# Patient Record
Sex: Female | Born: 1995 | Race: White | Hispanic: No | Marital: Single | Smoking: Never smoker
Health system: Southern US, Community
[De-identification: ages and names within clinical notes are randomized; demographics above are authoritative.]

## PROBLEM LIST (undated history)

## (undated) DIAGNOSIS — B977 Papillomavirus as the cause of diseases classified elsewhere: Secondary | ICD-10-CM

## (undated) DIAGNOSIS — K589 Irritable bowel syndrome without diarrhea: Secondary | ICD-10-CM

## (undated) DIAGNOSIS — R51 Headache: Secondary | ICD-10-CM

## (undated) DIAGNOSIS — R519 Headache, unspecified: Secondary | ICD-10-CM

## (undated) DIAGNOSIS — S060X9A Concussion with loss of consciousness of unspecified duration, initial encounter: Secondary | ICD-10-CM

## (undated) DIAGNOSIS — F419 Anxiety disorder, unspecified: Secondary | ICD-10-CM

## (undated) DIAGNOSIS — B009 Herpesviral infection, unspecified: Secondary | ICD-10-CM

## (undated) HISTORY — PX: TYMPANOSTOMY TUBE PLACEMENT: SHX32

## (undated) HISTORY — DX: Anxiety disorder, unspecified: F41.9

## (undated) HISTORY — DX: Concussion with loss of consciousness of unspecified duration, initial encounter: S06.0X9A

## (undated) HISTORY — DX: Papillomavirus as the cause of diseases classified elsewhere: B97.7

## (undated) HISTORY — DX: Headache: R51

## (undated) HISTORY — DX: Irritable bowel syndrome, unspecified: K58.9

## (undated) HISTORY — DX: Headache, unspecified: R51.9

## (undated) HISTORY — DX: Herpesviral infection, unspecified: B00.9

---
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
## 2014-02-07 ENCOUNTER — Ambulatory Visit: Payer: BC Managed Care – PPO | Admitting: Nurse Practitioner

## 2014-02-08 ENCOUNTER — Ambulatory Visit (INDEPENDENT_AMBULATORY_CARE_PROVIDER_SITE_OTHER): Payer: BC Managed Care – PPO | Admitting: Nurse Practitioner

## 2014-02-08 ENCOUNTER — Encounter: Payer: Self-pay | Admitting: Nurse Practitioner

## 2014-02-08 VITALS — BP 90/60 | HR 60 | Ht 63.5 in | Wt 124.0 lb

## 2014-02-08 DIAGNOSIS — Z113 Encounter for screening for infections with a predominantly sexual mode of transmission: Secondary | ICD-10-CM

## 2014-02-08 DIAGNOSIS — Z0000 Encounter for general adult medical examination without abnormal findings: Secondary | ICD-10-CM

## 2014-02-08 DIAGNOSIS — Z975 Presence of (intrauterine) contraceptive device: Secondary | ICD-10-CM

## 2014-02-08 DIAGNOSIS — Z01419 Encounter for gynecological examination (general) (routine) without abnormal findings: Secondary | ICD-10-CM

## 2014-02-08 DIAGNOSIS — M2550 Pain in unspecified joint: Secondary | ICD-10-CM

## 2014-02-08 NOTE — Progress Notes (Signed)
Patient ID: Hannah Pham, female   DOB: 11/16/1995, 18 y.o.   MRN: [id number] 18 y.o. G0P0 Single Caucasian Fe here for annual exam.  While on Mircette did have regular cycles that were shorter and less cramps. Then first of June misplaced pack of OCP and has been off.  Now wants Skyla Pham. She feels she is not a good candidate to remain on OCP secondary to compliance.   While here she states she has multiple joint pain that is fleeting and at random times. This pain or shooting sensation last seconds to less than a minute.  Seems to be more bothersome in the shoulders.  There is no swelling, warmth or loss of ROM with these events.   These symptoms have been ongoing since she was in elementary school.  Sister who is younger has similar symptoms.  Her mother has several auto immune disorders including Lupus and fibromyalgia.   Patient's last menstrual period was 01/21/2014.          Sexually active: no, not currently The current method of family planning is none.    Exercising: no  The patient does not participate in regular exercise at present. Smoker:  no  Health Maintenance: Pap:  never TDaP:  UTD Labs:  Declined    reports that she has never smoked. She does not have any smokeless tobacco history on file. She reports that she does not drink alcohol or use illicit drugs.  Past Medical History  Diagnosis Date  . Anxiety   . IBS (irritable bowel syndrome)     Past Surgical History  Procedure Laterality Date  . Tympanostomy tube placement Bilateral     Current Outpatient Prescriptions  Medication Sig Dispense Refill  . dicyclomine (BENTYL) 10 MG capsule Take 1 capsule by mouth as needed.      Marland Kitchen. ibuprofen (ADVIL,MOTRIN) 800 MG tablet Take 1 tablet (800 mg total) by mouth every 8 (eight) hours as needed for pain.  30 tablet  5  . Probiotic Product (SOLUBLE FIBER/PROBIOTICS PO) Take 1 capsule by mouth.       No current facility-administered medications for this visit.    Family  History  Problem Relation Age of Onset  . Hypertension Mother   . Hyperlipidemia Mother   . Migraines Mother   . Asthma Maternal Grandmother   . Colon cancer Maternal Grandfather   . Diabetes Maternal Grandfather   . Hyperlipidemia Maternal Grandfather     ROS:  Pertinent items are noted in HPI.  Otherwise, a comprehensive ROS was negative.  Exam:   BP 90/60  Pulse 60  Ht 5' 3.5" (1.613 m)  Wt 124 lb (56.246 kg)  BMI 21.62 kg/m2  LMP 01/21/2014 Height: 5' 3.5" (161.3 cm)  Ht Readings from Last 3 Encounters:  02/08/14 5' 3.5" (1.613 m) (38%*, Z = -0.30)  10/27/13 5' 3.75" (1.619 m) (42%*, Z = -0.20)  06/04/13 5\' 3"  (1.6 m) (31%*, Z = -0.49)   * Growth percentiles are based on CDC 2-20 Years data.    General appearance: alert, cooperative and appears stated age Head: Normocephalic, without obvious abnormality, atraumatic Neck: no adenopathy, supple, symmetrical, trachea midline and thyroid normal to inspection and palpation Lungs: clear to auscultation bilaterally Breasts: normal appearance, no masses or tenderness Heart: regular rate and rhythm Abdomen: soft, non-tender; no masses,  no organomegaly Extremities: extremities normal, atraumatic, no cyanosis or edema Skin: Skin color, texture, turgor normal. No rashes or lesions Lymph nodes: Cervical, supraclavicular, and axillary nodes normal. No  abnormal inguinal nodes palpated Neurologic: Grossly normal   Pelvic: External genitalia:  no lesions              Urethra:  normal appearing urethra with no masses, tenderness or lesions              Bartholin's and Skene's: normal                 Vagina: normal appearing vagina with normal color and discharge, no lesions              Cervix: anteverted              Pap taken: no Bimanual Exam:  Uterus:  normal size, contour, position, consistency, mobility, non-tender              Adnexa: no mass, fullness, tenderness               Rectovaginal: Confirms               Anus:   normal sphincter tone, no lesions  A:  Well Woman with normal exam  Contraception - desires Hannah Pham  R/O STD's  Possible connective tissue disorder ?  P:   Reviewed health and wellness pertinent to exam  Pap smear not taken today  Asked to discuss with mother and go to see Rheumatologist for further eval of multiple joint pain.  Will proceed with Skyla order and she will need Cytotec for the procedure.    She is given handout information to review.  Will check STD's prior to insertion.  To use BUM of birth control in the interim  Counseled on breast self exam, STD prevention, HIV risk factors and prevention, use and side effects of OCP's, family planning choices, adequate intake of calcium and vitamin D, diet and exercise return annually or prn  An After Visit Summary was printed and given to the patient.

## 2014-02-08 NOTE — Patient Instructions (Signed)
General topics  Next pap or exam is  due in 1 year Take a Women's multivitamin Take 1200 mg. of calcium daily - prefer dietary If any concerns in interim to call back  Breast Self-Awareness Practicing breast self-awareness may pick up problems early, prevent significant medical complications, and possibly save your life. By practicing breast self-awareness, you can become familiar with how your breasts look and feel and if your breasts are changing. This allows you to notice changes early. It can also offer you some reassurance that your breast health is good. One way to learn what is normal for your breasts and whether your breasts are changing is to do a breast self-exam. If you find a lump or something that was not present in the past, it is best to contact your caregiver right away. Other findings that should be evaluated by your caregiver include nipple discharge, especially if it is bloody; skin changes or reddening; areas where the skin seems to be pulled in (retracted); or new lumps and bumps. Breast pain is seldom associated with cancer (malignancy), but should also be evaluated by a caregiver. BREAST SELF-EXAM The best time to examine your breasts is 5 7 days after your menstrual period is over.  ExitCare Patient Information 2013 [employer].   Exercise to Stay Healthy Exercise helps you become and stay healthy. EXERCISE IDEAS AND TIPS Choose exercises that:  You enjoy.  Fit into your day. You do not need to exercise really hard to be healthy. You can do exercises at a slow or medium level and stay healthy. You can:  Stretch before and after working out.  Try yoga, Pilates, or tai chi.  Lift weights.  Walk fast, swim, jog, run, climb stairs, bicycle, dance, or rollerskate.  Take aerobic classes. Exercises that burn about 150 calories:  Running 1  miles in 15 minutes.  Playing volleyball for 45 to 60 minutes.  Washing and waxing a car for 45 to 60  minutes.  Playing touch football for 45 minutes.  Walking 1  miles in 35 minutes.  Pushing a stroller 1  miles in 30 minutes.  Playing basketball for 30 minutes.  Raking leaves for 30 minutes.  Bicycling 5 miles in 30 minutes.  Walking 2 miles in 30 minutes.  Dancing for 30 minutes.  Shoveling snow for 15 minutes.  Swimming laps for 20 minutes.  Walking up stairs for 15 minutes.  Bicycling 4 miles in 15 minutes.  Gardening for 30 to 45 minutes.  Jumping rope for 15 minutes.  Washing windows or floors for 45 to 60 minutes. Document Released: 09/07/2010 Document Revised: 10/28/2011 Document Reviewed: 09/07/2010 ExitCare Patient Information 2013 [employer].   Other topics ( that may be useful information):    Sexually Transmitted Disease Sexually transmitted disease (STD) refers to any infection that is passed from person to person during sexual activity. This may happen by way of saliva, semen, blood, vaginal mucus, or urine. Common STDs include:  Gonorrhea.  Chlamydia.  Syphilis.  HIV/AIDS.  Genital herpes.  Hepatitis B and C.  Trichomonas.  Human papillomavirus (HPV).  Pubic lice. CAUSES  An STD may be spread by bacteria, virus, or parasite. A person can get an STD by:  Sexual intercourse with an infected person.  Sharing sex toys with an infected person.  Sharing needles with an infected person.  Having intimate contact with the genitals, mouth, or rectal areas of an infected person. SYMPTOMS  Some people may not have any symptoms, but   they can still pass the infection to others. Different STDs have different symptoms. Symptoms include:  Painful or bloody urination.  Pain in the pelvis, abdomen, vagina, anus, throat, or eyes.  Skin rash, itching, irritation, growths, or sores (lesions). These usually occur in the genital or anal area.  Abnormal vaginal discharge.  Penile discharge in men.  Soft, flesh-colored skin growths in the  genital or anal area.  Fever.  Pain or bleeding during sexual intercourse.  Swollen glands in the groin area.  Yellow skin and eyes (jaundice). This is seen with hepatitis. DIAGNOSIS  To make a diagnosis, your caregiver may:  Take a medical history.  Perform a physical exam.  Take a specimen (culture) to be examined.  Examine a sample of discharge under a microscope.  Perform blood test TREATMENT   Chlamydia, gonorrhea, trichomonas, and syphilis can be cured with antibiotic medicine.  Genital herpes, hepatitis, and HIV can be treated, but not cured, with prescribed medicines. The medicines will lessen the symptoms.  Genital warts from HPV can be treated with medicine or by freezing, burning (electrocautery), or surgery. Warts may come back.  HPV is a virus and cannot be cured with medicine or surgery.However, abnormal areas may be followed very closely by your caregiver and may be removed from the cervix, vagina, or vulva through office procedures or surgery. If your diagnosis is confirmed, your recent sexual partners need treatment. This is true even if they are symptom-free or have a negative culture or evaluation. They should not have sex until their caregiver says it is okay. HOME CARE INSTRUCTIONS  All sexual partners should be informed, tested, and treated for all STDs.  Take your antibiotics as directed. Finish them even if you start to feel better.  Only take over-the-counter or prescription medicines for pain, discomfort, or fever as directed by your caregiver.  Rest.  Eat a balanced diet and drink enough fluids to keep your urine clear or pale yellow.  Do not have sex until treatment is completed and you have followed up with your caregiver. STDs should be checked after treatment.  Keep all follow-up appointments, Pap tests, and blood tests as directed by your caregiver.  Only use latex condoms and water-soluble lubricants during sexual activity. Do not use  petroleum jelly or oils.  Avoid alcohol and illegal drugs.  Get vaccinated for HPV and hepatitis. If you have not received these vaccines in the past, talk to your caregiver about whether one or both might be right for you.  Avoid risky sex practices that can break the skin. The only way to avoid getting an STD is to avoid all sexual activity.Latex condoms and dental dams (for oral sex) will help lessen the risk of getting an STD, but will not completely eliminate the risk. SEEK MEDICAL CARE IF:   You have a fever.  You have any new or worsening symptoms. Document Released: 10/26/2002 Document Revised: 10/28/2011 Document Reviewed: 11/02/2010 [facility] Patient Information 2013 [employer].    Domestic Abuse You are being battered or abused if someone close to you hits, pushes, or physically hurts you in any way. You also are being abused if you are forced into activities. You are being sexually abused if you are forced to have sexual contact of any kind. You are being emotionally abused if you are made to feel worthless or if you are constantly threatened. It is important to remember that help is available. No one has the right to abuse you. PREVENTION OF FURTHER  ABUSE  Learn the warning signs of danger. This varies with situations but may include: the use of alcohol, threats, isolation from friends and family, or forced sexual contact. Leave if you feel that violence is going to occur.  If you are attacked or beaten, report it to the police so the abuse is documented. You do not have to press charges. The police can protect you while you or the attackers are leaving. Get the officer's name and badge number and a copy of the report.  Find someone you can trust and tell them what is happening to you: your caregiver, a nurse, clergy member, close friend or family member. Feeling ashamed is natural, but remember that you have done nothing wrong. No one deserves abuse. Document Released:  08/02/2000 Document Revised: 10/28/2011 Document Reviewed: 10/11/2010 ExitCare Patient Information 2013 [employer].    How Much is Too Much Alcohol? Drinking too much alcohol can cause injury, accidents, and health problems. These types of problems can include:   Car crashes.  Falls.  Family fighting (domestic violence).  Drowning.  Fights.  Injuries.  Burns.  Damage to certain organs.  Having a baby with birth defects. ONE DRINK CAN BE TOO MUCH WHEN YOU ARE:  Working.  Pregnant or breastfeeding.  Taking medicines. Ask your doctor.  Driving or planning to drive. If you or someone you know has a drinking problem, get help from a doctor.  Document Released: 06/01/2009 Document Revised: 10/28/2011 Document Reviewed: 06/01/2009 ExitCare Patient Information 2013 [employer].   Smoking Hazards Smoking cigarettes is extremely bad for your health. Tobacco smoke has over 200 known poisons in it. There are over 60 chemicals in tobacco smoke that cause cancer. Some of the chemicals found in cigarette smoke include:   Cyanide.  Benzene.  Formaldehyde.  Methanol (wood alcohol).  Acetylene (fuel used in welding torches).  Ammonia. Cigarette smoke also contains the poisonous gases nitrogen oxide and carbon monoxide.  Cigarette smokers have an increased risk of many serious medical problems and Smoking causes approximately:  90% of all lung cancer deaths in men.  80% of all lung cancer deaths in women.  90% of deaths from chronic obstructive lung disease. Compared with nonsmokers, smoking increases the risk of:  Coronary heart disease by 2 to 4 times.  Stroke by 2 to 4 times.  Men developing lung cancer by 23 times.  Women developing lung cancer by 13 times.  Dying from chronic obstructive lung diseases by 12 times.  . Smoking is the most preventable cause of death and disease in our society.  WHY IS SMOKING ADDICTIVE?  Nicotine is the chemical  agent in tobacco that is capable of causing addiction or dependence.  When you smoke and inhale, nicotine is absorbed rapidly into the bloodstream through your lungs. Nicotine absorbed through the lungs is capable of creating a powerful addiction. Both inhaled and non-inhaled nicotine may be addictive.  Addiction studies of cigarettes and spit tobacco show that addiction to nicotine occurs mainly during the teen years, when young people begin using tobacco products. WHAT ARE THE BENEFITS OF QUITTING?  There are many health benefits to quitting smoking.   Likelihood of developing cancer and heart disease decreases. Health improvements are seen almost immediately.  Blood pressure, pulse rate, and breathing patterns start returning to normal soon after quitting. QUITTING SMOKING   American Lung Association - 1-800-LUNGUSA  American Cancer Society - 1-800-ACS-2345 Document Released: 09/12/2004 Document Revised: 10/28/2011 Document Reviewed: 05/17/2009 ExitCare Patient Information 2013 ExitCare[employer]   LLC.   Stress Management Stress is a state of physical or mental tension that often results from changes in your life or normal routine. Some common causes of stress are:  Death of a loved one.  Injuries or severe illnesses.  Getting fired or changing jobs.  Moving into a new home. Other causes may be:  Sexual problems.  Business or financial losses.  Taking on a large debt.  Regular conflict with someone at home or at work.  Constant tiredness from lack of sleep. It is not just bad things that are stressful. It may be stressful to:  Win the lottery.  Get married.  Buy a new car. The amount of stress that can be easily tolerated varies from person to person. Changes generally cause stress, regardless of the types of change. Too much stress can affect your health. It may lead to physical or emotional problems. Too little stress (boredom) may also become stressful. SUGGESTIONS TO  REDUCE STRESS:  Talk things over with your family and friends. It often is helpful to share your concerns and worries. If you feel your problem is serious, you may want to get help from a professional counselor.  Consider your problems one at a time instead of lumping them all together. Trying to take care of everything at once may seem impossible. List all the things you need to do and then start with the most important one. Set a goal to accomplish 2 or 3 things each day. If you expect to do too many in a single day you will naturally fail, causing you to feel even more stressed.  Do not use alcohol or drugs to relieve stress. Although you may feel better for a short time, they do not remove the problems that caused the stress. They can also be habit forming.  Exercise regularly - at least 3 times per week. Physical exercise can help to relieve that "uptight" feeling and will relax you.  The shortest distance between despair and hope is often a good night's sleep.  Go to bed and get up on time allowing yourself time for appointments without being rushed.  Take a short "time-out" period from any stressful situation that occurs during the day. Close your eyes and take some deep breaths. Starting with the muscles in your face, tense them, hold it for a few seconds, then relax. Repeat this with the muscles in your neck, shoulders, hand, stomach, back and legs.  Take good care of yourself. Eat a balanced diet and get plenty of rest.  Schedule time for having fun. Take a break from your daily routine to relax. HOME CARE INSTRUCTIONS   Call if you feel overwhelmed by your problems and feel you can no longer manage them on your own.  Return immediately if you feel like hurting yourself or someone else. Document Released: 01/29/2001 Document Revised: 10/28/2011 Document Reviewed: 09/21/2007 ExitCare Patient Information 2013 [employer].  

## 2014-02-09 LAB — STD PANEL
HIV 1&2 Ab, 4th Generation: NONREACTIVE
Hepatitis B Surface Ag: NEGATIVE

## 2014-02-10 LAB — IPS N GONORRHOEA AND CHLAMYDIA BY PCR

## 2014-02-14 DIAGNOSIS — M2550 Pain in unspecified joint: Secondary | ICD-10-CM | POA: Insufficient documentation

## 2014-02-14 NOTE — Progress Notes (Signed)
Encounter reviewed by Dr. Brook Silva.  

## 2014-02-22 ENCOUNTER — Telehealth: Payer: Self-pay | Admitting: Obstetrics and Gynecology

## 2014-02-22 NOTE — Telephone Encounter (Signed)
Left message for patient to call back. Need to go over IUD benefits °

## 2014-02-22 NOTE — Telephone Encounter (Signed)
Spoke with patient. Advised that per benefits quote received, IUD and insertion is covered at 100%. There will be 0 patient liability. Patient is to call within the first 5 days of her cycle to schedule insertion. °

## 2014-02-28 ENCOUNTER — Telehealth: Payer: Self-pay | Admitting: Nurse Practitioner

## 2014-02-28 MED ORDER — DESOGESTREL-ETHINYL ESTRADIOL 0.15-0.02/0.01 MG (21/5) PO TABS: 1.0000 | ORAL_TABLET | Freq: Every day | ORAL | Status: DC

## 2014-02-28 NOTE — Telephone Encounter (Signed)
Patient would like to talk with a nurse regarding an IUD. Patient is not sure she wants to have this inserted. Patient has more questions.

## 2014-02-28 NOTE — Telephone Encounter (Signed)
Message left to return call to Hannah Pham at [phone].    

## 2014-02-28 NOTE — Telephone Encounter (Signed)
Please be sure to remind patient about compliance to OCP.  New RX sent to pharmacy.  If she is not on a pill pack now then remind her when to start and BUM of BC.

## 2014-02-28 NOTE — Telephone Encounter (Signed)
Patient has decided that she does not want to go forward with having Skyla IUD placed. Has read about uterine embedding and perforation and does not want to take that risk.  Would like refills of Mircette as she was on this in the past and would like to continue.  Advised would send message to Lauro FranklinPatricia Rolen-Grubb, FNP to ensure it was okay to order rx. She is agreeable to this.  Patty okay to place order for Mirecette until next annual?

## 2014-03-01 NOTE — Telephone Encounter (Signed)
Call to patient, [facility] to [facility].

## 2014-03-01 NOTE — Telephone Encounter (Signed)
Patient called right back. Notified that Hannah Pham has sent  RX to pharm for Mircette. She is not currently on OCP so advied this is a restart and she should wait for next menses to restart. Patient agreeable and will start on first day of cycle as Hannah Pham had her do before. Aware to use BUM first month and that important to take same time QD to decrease potential for BTB and decreased effectiveness. Reminded BTB common first 3 months of restarting.

## 2014-10-24 ENCOUNTER — Telehealth: Payer: Self-pay | Admitting: Nurse Practitioner

## 2014-10-24 ENCOUNTER — Ambulatory Visit (INDEPENDENT_AMBULATORY_CARE_PROVIDER_SITE_OTHER): Payer: BLUE CROSS/BLUE SHIELD | Admitting: Certified Nurse Midwife

## 2014-10-24 ENCOUNTER — Encounter: Payer: Self-pay | Admitting: Nurse Practitioner

## 2014-10-24 VITALS — BP 114/66 | HR 76 | Temp 98.1°F | Ht 63.5 in | Wt 123.0 lb

## 2014-10-24 DIAGNOSIS — B9689 Other specified bacterial agents as the cause of diseases classified elsewhere: Secondary | ICD-10-CM

## 2014-10-24 DIAGNOSIS — A499 Bacterial infection, unspecified: Secondary | ICD-10-CM | POA: Diagnosis not present

## 2014-10-24 DIAGNOSIS — Z113 Encounter for screening for infections with a predominantly sexual mode of transmission: Secondary | ICD-10-CM

## 2014-10-24 DIAGNOSIS — N760 Acute vaginitis: Secondary | ICD-10-CM | POA: Diagnosis not present

## 2014-10-24 NOTE — Telephone Encounter (Signed)
Spoke with patient. Patient states "Last Thursday I went out with my friends and I can not remember if I took my tampon out that night or not. I have searched for it and can not feel it. I do not know if it is really far up or if I really took it out." Patient is currently still on cycle and has been wearing pads. Having light abdominal cramping. Denies nausea, vomiting, fever, and chills. Advised patient will need to be seen for evaluation in office. Patient is agreeable. Appointment scheduled for today at 4:15pm with Hannah FranklinPatricia Rolen-Grubb, FNP. Patient is also requesting STD screening. Advised to speak with provider regarding this at appointment. Patient is agreeable.  Routing to provider for final review. Patient agreeable to disposition. Will close encounter

## 2014-10-24 NOTE — Telephone Encounter (Signed)
Patient believes she has a retained tampon.

## 2014-10-24 NOTE — Patient Instructions (Signed)
Pregnancy and Sexually Transmitted Diseases A sexually transmitted disease (STD) is a disease or infection that may be passed (transmitted) from person to person, usually during sexual activity. This may happen by way of saliva, semen, blood, vaginal mucus, or urine. An STD can be caused by bacteria, viruses, or parasites.  During pregnancy, STDs can be dangerous for both you and your unborn baby. It is important to take steps to reduce your chances of getting an STD. Also, you need to be looked at by your health care provider right away if you think you may have an STD or may have been exposed to an STD. Diagnosis and treatment will depend on the type of STD. WHAT ARE SOME COMMON STDs? There are different types of STDs. Some STDs that cause problems in pregnancy include:  Gonorrhea.   Chlamydia.   Syphilis.   HIV and AIDS.   Genital herpes.   Hepatitis.   Genital warts.   Human papillomavirus (HPV). STDs that do not affect the baby include:   Trichomonas.   Pubic lice.  WHAT ARE THE POSSIBLE EFFECTS OF STDs DURING PREGNANCY? STDs can have various effects during pregnancy. STDs can cause:   Stillbirth.   Miscarriage.   Premature labor.   Premature rupture of the membranes.   Serious birth defects or deformities.   Infection of the amniotic sac.   Infections that occur after birth (postpartum) in you and the baby.   Slowed growth of the baby before birth.   Illnesses in newborns.  WHAT ARE COMMON SYMPTOMS OF STDs? Different STDs have different symptoms. Some women may not have any symptoms. If symptoms are present, they may include:  Painful or bloody urination.   Pain in the pelvis, abdomen, vagina, anus, throat, or eyes.  A skin rash, itching, or irritation.   Growths, ulcerations, blisters, or sores in the genital and anal areas.   Fever.   Abnormal vaginal discharge with or without bad odor.   Pain or bleeding during sexual  intercourse.   Yellow skin and eyes (jaundice). This is seen with hepatitis.   Swollen glands in the groin area.  Even if symptoms are not present, an STD can still be passed to another person during sexual contact.  HOW ARE STDs DIAGNOSED? Your health care provider can determine if you have an STD through different tests. These can include blood tests, urine tests, and tests performed during a pelvic exam. You should be screened for sexually transmitted illnesses (STIs), including gonorrhea and chlamydia if:   You are sexually active and are younger than 20 years old.  You are older than 20 years old and your health care provider tells you that you are at risk for this type of infection.  Your sexual activity has changed since you were last screened, and you are at an increased risk for chlamydia or gonorrhea. Ask your health care provider if you are at risk. HOW CAN I REDUCE MY RISK OF GETTING AN STD?  Take these steps to reduce your risk of getting an STD:  Use a latex condom or female condom during sexual intercourse.   Use dental dams and water-soluble lubricants during sexual activity. Do not use petroleum jelly or oils.  Avoid having multiple sex partners.  Do not have sex with someone who has other sex partners.  Do not have sex with anyone you do not know or who is at high risk for an STD.   Avoid risky sex acts that can break the  skin.  Do not have sex if you have open sores on your mouth or skin.  Avoid engaging in oral and anal sex acts.   Get the hepatitis vaccine. It is safe for pregnant women.  WHAT SHOULD I DO IF I THINK I HAVE AN STD?  See your health care provider.  Tell your sexual partner(s). They should be tested and treated for any STDs.  Do not have sex until your health care provider says it is okay. WHEN SHOULD I GET IMMEDIATE MEDICAL CARE? Contact your health care provider right away if:   You have any symptoms of an STD.  You think you  or your sex partner has an STD, even if there are no symptoms.  You think you may have been exposed to an STD. Document Released: 09/12/2004 Document Revised: 12/20/2013 Document Reviewed: 03/04/2013 [facility] Patient Information 2015 [place], [place]. This information is not intended to replace advice given to you by your health care provider. Make sure you discuss any questions you have with your health care provider.

## 2014-10-24 NOTE — Progress Notes (Signed)
[age] y.o.  Single white female  g0p0 here with complaint of vaginal symptoms of itching only after tampon use. Trust issues from previous partner, desires STD screening. Also concerned about if she had taken her tampon out or not. Still on menses today. Please check. Onset of symptoms last 24 hours days ago. Denies new personal products. No urinary symptoms.  O:Healthy female WDWN Affect: normal, orientation x 3  Exam: Abdomen:soft, non tender, no rebound Lymph node: no enlargement or tenderness Pelvic exam: External genital: normal female, no lesions, scaling or exudate BUS: negative Vagina: blood present in vagina, no tampon noted  Affirm taken  Cervix: normal, non tender, negative CMT Uterus: normal, non tender Adnexa:normal, non tender, no masses or fullness noted   A:Normal pelvic exam STD screening No retained tampon noted   P:Discussed findings of normal pelvic exam and no retained tampon noted.  Discussed affirm results would indicate if BV,yeast or trichomonas and she will be notified when labs are in. Lab: GC,Chlamydia, STD panel Stressed condom use for increased prevention of STD's. Questions addressed.  Rv prn

## 2014-10-25 LAB — STD PANEL
HIV 1&2 Ab, 4th Generation: NONREACTIVE
Hepatitis B Surface Ag: NEGATIVE

## 2014-10-25 NOTE — Progress Notes (Signed)
Reviewed personally.  M. Suzanne Ghazi Rumpf, MD.  

## 2014-10-26 LAB — WET PREP BY MOLECULAR PROBE
Candida species: NEGATIVE
Gardnerella vaginalis: POSITIVE — AB
Trichomonas vaginosis: NEGATIVE

## 2014-10-26 LAB — IPS N GONORRHOEA AND CHLAMYDIA BY PCR

## 2014-10-27 MED ORDER — METRONIDAZOLE 0.75 % VA GEL: 1.0000 | Freq: Two times a day (BID) | VAGINAL | Status: DC

## 2014-10-27 NOTE — Addendum Note (Signed)
Addended by: Verner CholLEONARD, Teniqua Marron S on: 10/27/2014 05:18 PM   Modules accepted: Orders

## 2014-10-28 ENCOUNTER — Telehealth: Payer: Self-pay

## 2014-10-28 NOTE — Telephone Encounter (Signed)
Return call to Joy. °

## 2014-10-28 NOTE — Telephone Encounter (Signed)
Patient notified of results. See lab 

## 2014-10-28 NOTE — Telephone Encounter (Signed)
lmtcb

## 2014-10-28 NOTE — Telephone Encounter (Signed)
-----   Message from Verner Choleborah S Leonard, CNM sent at 10/27/2014  5:17 PM EST ----- Notify patient that HIV,RPR, Hep B, GC, Chlamydia negative Affirm positive with BV will need Metrogel order in Trichomonas and Yeast are negative

## 2014-10-28 NOTE — Telephone Encounter (Signed)
Left message for call back.

## 2014-12-23 ENCOUNTER — Telehealth: Payer: Self-pay | Admitting: Certified Nurse Midwife

## 2014-12-23 NOTE — Telephone Encounter (Signed)
Patient thinks she may have bacterial vaginosis.

## 2014-12-23 NOTE — Telephone Encounter (Signed)
Spoke with patient. Patient states that she feels she may have a bacterial infection. "Last time I was in she told me I had BV and treated me and it never seemed to go away. Then I thought I had a yeast infection so I use monistat and it was like my body rejected it. I tried again and I started to get a rash so I stopped." Patient is currently having vaginal discharge with itching. Patient will not be home from college until 5/13. Appointment scheduled for 5/13 at 4pm with Verner Choleborah S. Leonard CNM. Patient is agreeable. Advised if symptoms worsen can be seen at local urgent care for treatment.   Routing to provider for final review. Patient agreeable to disposition. Patient aware provider will review message and nurse will return call with any additional instructions or change of disposition. Will close encounter.

## 2014-12-30 ENCOUNTER — Encounter: Payer: Self-pay | Admitting: Certified Nurse Midwife

## 2014-12-30 ENCOUNTER — Ambulatory Visit (INDEPENDENT_AMBULATORY_CARE_PROVIDER_SITE_OTHER): Payer: BLUE CROSS/BLUE SHIELD | Admitting: Certified Nurse Midwife

## 2014-12-30 VITALS — BP 110/68 | HR 70 | Resp 16 | Ht 63.5 in | Wt 125.0 lb

## 2014-12-30 DIAGNOSIS — N898 Other specified noninflammatory disorders of vagina: Secondary | ICD-10-CM | POA: Diagnosis not present

## 2014-12-30 NOTE — Progress Notes (Signed)
18 y.o.Single  g0p0 here with complaint of vaginal symptoms of tiny bumps worried she has HSV, and increase sour smelling discharge. Describes discharge as clear. Onset of symptoms 14 days ago. Denies new personal products. Patient was treated for BV in 3/16 and completed Metrogel as ordered. She felt her discharge was normal, so treated with Monistat for one week and two weeks later used suppositories. Shaves once weekly, no thong use. No change in partner or other STD concerns. Screened in 3/16 all negative except BV. No UTI symptoms or other concerns.. Contraception is OCP working well, no missed pills. Mother with patient, patient verbally OK with mother being here and all conversations. No other health issues.   O:Healthy female WDWN Affect: normal, orientation x 3  Exam: Abdomen:soft. Non tender Lymph node: no enlargement or tenderness inguinal Pelvic exam: External genital: normal female very tiny fine heat appearance macule noted in crease of groin area. No blisters or lesions noted.  BUS: negative Vagina: white non odorous discharge noted.  Affirm taken Cervix: normal, non tender, no CMT Uterus: normal, non tender Adnexa:normal, non tender, no masses or fullness noted   A:Normal pelvic exam No evidence of HSV, declines screening at this time, will consider at aex in 6/16. Heat or shaving bumps noted   P:Reassured normal exam. Discussed findings of no evidence of HSV and heat related vs shaving bumps noted and etiology. Discussed baking soda sitz bath for comfort. Avoid moist clothes or pads for extended period of time. If working out in gym clothes or swim suits for long periods of time change underwear or bottoms of swimsuit if possible.  Will treat if indicated by affirm. Questions addressed. Keep aex appointment.  Rv prn

## 2014-12-31 LAB — WET PREP BY MOLECULAR PROBE
Candida species: NEGATIVE
Gardnerella vaginalis: NEGATIVE
Trichomonas vaginosis: NEGATIVE

## 2015-01-01 NOTE — Progress Notes (Signed)
Reviewed personally.  M. Suzanne Mercie Balsley, MD.  

## 2015-01-06 ENCOUNTER — Telehealth: Payer: Self-pay | Admitting: Pediatrics

## 2015-01-06 NOTE — Telephone Encounter (Signed)
Lm on vm for pt to cb and sch appt. °

## 2015-01-06 NOTE — Telephone Encounter (Signed)
Pt 's mom sees  you and pt goes to college. Is home for the summer.  Would like to know if you can work her in for a new pt appt within the next month?

## 2015-01-06 NOTE — Telephone Encounter (Signed)
Yes   Please bring immunization records   thanks

## 2015-01-13 NOTE — Telephone Encounter (Signed)
Pt has been scheduled.  °

## 2015-02-14 ENCOUNTER — Other Ambulatory Visit: Payer: Self-pay | Admitting: Nurse Practitioner

## 2015-02-14 NOTE — Telephone Encounter (Signed)
Medication refill request: Viorelle  Last AEX:  02/08/14 PG Next AEX: ? Last MMG (if hormonal medication request): None Refill authorized: Mircette 02/28/14 #3packs w/3R. Today #1pack/0R?  Patient will need AEX for further refills.

## 2015-02-27 ENCOUNTER — Ambulatory Visit (INDEPENDENT_AMBULATORY_CARE_PROVIDER_SITE_OTHER): Payer: BLUE CROSS/BLUE SHIELD | Admitting: Internal Medicine

## 2015-02-27 ENCOUNTER — Encounter: Payer: Self-pay | Admitting: Internal Medicine

## 2015-02-27 VITALS — BP 106/68 | Temp 98.1°F | Ht 63.0 in | Wt 121.8 lb

## 2015-02-27 DIAGNOSIS — M542 Cervicalgia: Secondary | ICD-10-CM | POA: Diagnosis not present

## 2015-02-27 DIAGNOSIS — G44329 Chronic post-traumatic headache, not intractable: Secondary | ICD-10-CM

## 2015-02-27 MED ORDER — AMITRIPTYLINE HCL 10 MG PO TABS: 10.0000 mg | ORAL_TABLET | Freq: Every day | ORAL | Status: DC
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
## 2015-03-09 ENCOUNTER — Ambulatory Visit: Payer: BLUE CROSS/BLUE SHIELD | Attending: Internal Medicine | Admitting: Physical Therapy

## 2015-03-09 DIAGNOSIS — M542 Cervicalgia: Secondary | ICD-10-CM | POA: Diagnosis not present

## 2015-03-09 DIAGNOSIS — R293 Abnormal posture: Secondary | ICD-10-CM | POA: Diagnosis present

## 2015-03-09 DIAGNOSIS — R531 Weakness: Secondary | ICD-10-CM | POA: Diagnosis present
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
## 2015-03-09 NOTE — Patient Instructions (Signed)
  Flexibility: Upper Trapezius Stretch   Gently grasp right side of head while reaching behind back with other hand. Tilt head away until a gentle stretch is felt. Hold 30____ seconds. Repeat _3___ times per set. Do ____ sets per session. Do __2__ sessions per day.  [email or web address]   Levator Stretch   Grasp seat or sit on hand on side to be stretched. Turn head toward other side and look down. Use hand on head to gently stretch neck in that position. Hold _30___ seconds. Repeat on other side. Repeat _3___ times. Do __2__ sessions per day.  [email or web address]   Scapular Retraction (Standing)   With arms at sides, pinch shoulder blades together. Repeat 10____ times per set. Do ____ sets per session. Do _3-4___ sessions per day.  [email or web address]   Flexibility: Neck Retraction   Pull head straight back, keeping eyes and jaw level. Repeat _10___ times per set. Do ____ sets per session. Do __3__ sessions per day.  [email or web address]   Posture - Sitting   Sit upright, head facing forward. Try using a roll to support lower back. Keep shoulders relaxed, and avoid rounded back. Keep hips level with knees. Avoid crossing legs for long periods.   Flexibility: Corner Stretch   Standing in corner with hands just above shoulder level or stand in a doorway. Lean forward until a comfortable stretch is felt across chest. Hold __30__ seconds. Repeat __3__ times per set. Do ____ sets per session. Do _2___ sessions per day.  [email or web address]   Copyright  VHI. All rights reserved.  Solon Palm, PT 03/09/2015 4:07 PM [facility] [address] [place], [place] [address] Phone # [phone] Fax [phone]

## 2015-03-14 ENCOUNTER — Ambulatory Visit: Payer: BLUE CROSS/BLUE SHIELD | Admitting: Nurse Practitioner

## 2015-03-15 ENCOUNTER — Encounter: Payer: Self-pay | Admitting: Obstetrics and Gynecology

## 2015-03-15 ENCOUNTER — Ambulatory Visit (INDEPENDENT_AMBULATORY_CARE_PROVIDER_SITE_OTHER): Payer: BLUE CROSS/BLUE SHIELD | Admitting: Obstetrics and Gynecology

## 2015-03-15 VITALS — BP 110/68 | HR 72 | Resp 16 | Ht 63.75 in | Wt 121.6 lb

## 2015-03-15 DIAGNOSIS — Z01419 Encounter for gynecological examination (general) (routine) without abnormal findings: Secondary | ICD-10-CM | POA: Diagnosis not present

## 2015-03-15 MED ORDER — DESOGESTREL-ETHINYL ESTRADIOL 0.15-0.02/0.01 MG (21/5) PO TABS: 1.0000 | ORAL_TABLET | Freq: Every day | ORAL | Status: DC

## 2015-03-15 NOTE — Addendum Note (Signed)
Addended by: Melida Quitter on: 03/15/2015 03:18 PM   Modules accepted: Kipp Brood
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
## 2015-03-17 ENCOUNTER — Ambulatory Visit: Payer: BLUE CROSS/BLUE SHIELD | Admitting: Physical Therapy

## 2015-03-17 ENCOUNTER — Encounter: Payer: Self-pay | Admitting: Physical Therapy

## 2015-03-17 DIAGNOSIS — M542 Cervicalgia: Secondary | ICD-10-CM | POA: Diagnosis not present

## 2015-03-17 DIAGNOSIS — R531 Weakness: Secondary | ICD-10-CM

## 2015-03-17 DIAGNOSIS — R293 Abnormal posture: Secondary | ICD-10-CM
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
## 2015-03-22 ENCOUNTER — Ambulatory Visit: Payer: BLUE CROSS/BLUE SHIELD | Attending: Internal Medicine | Admitting: Physical Therapy

## 2015-03-22 ENCOUNTER — Encounter: Payer: Self-pay | Admitting: Physical Therapy

## 2015-03-22 DIAGNOSIS — R293 Abnormal posture: Secondary | ICD-10-CM

## 2015-03-22 DIAGNOSIS — R531 Weakness: Secondary | ICD-10-CM | POA: Insufficient documentation

## 2015-03-22 DIAGNOSIS — M542 Cervicalgia: Secondary | ICD-10-CM | POA: Diagnosis present
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
## 2015-03-28 ENCOUNTER — Ambulatory Visit: Payer: BLUE CROSS/BLUE SHIELD

## 2015-03-28 ENCOUNTER — Other Ambulatory Visit: Payer: Self-pay | Admitting: Nurse Practitioner

## 2015-03-28 DIAGNOSIS — M542 Cervicalgia: Secondary | ICD-10-CM

## 2015-03-28 DIAGNOSIS — R531 Weakness: Secondary | ICD-10-CM

## 2015-03-28 DIAGNOSIS — R293 Abnormal posture: Secondary | ICD-10-CM
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
## 2015-03-28 NOTE — Patient Instructions (Signed)
  PNF Strengthening: Resisted   Standing with resistive band around each hand, bring right arm up and away, thumb back. Repeat _10___ times per set. Do _2___ sets per session. Do _1-2___ sessions per day.      Resisted Horizontal Abduction: Bilateral   Sit or stand, tubing in both hands, arms out in front. Keeping arms straight, pinch shoulder blades together and stretch arms out. Repeat _10___ times per set. Do 2____ sets per session. Do _1-2___ sessions per day.   Scapular Retraction: Elbow Flexion (Standing)   With elbows bent to 90, pinch shoulder blades together and rotate arms out, keeping elbows bent. Repeat _10___ times per set. Do _1___ sets per session. Do many____ sessions per day.    [facility] [address], [address] [place], [place] [address] Phone # [phone] Fax [phone]  

## 2015-03-28 NOTE — Telephone Encounter (Signed)
03/15/15 #3 packs/3 rfs sent to [facility]- rx denied.

## 2015-03-30 ENCOUNTER — Ambulatory Visit: Payer: BLUE CROSS/BLUE SHIELD | Admitting: Physical Therapy

## 2015-03-31 ENCOUNTER — Ambulatory Visit: Payer: BLUE CROSS/BLUE SHIELD | Admitting: Physical Therapy

## 2015-03-31 ENCOUNTER — Encounter: Payer: Self-pay | Admitting: Physical Therapy

## 2015-03-31 DIAGNOSIS — M542 Cervicalgia: Secondary | ICD-10-CM

## 2015-03-31 DIAGNOSIS — R293 Abnormal posture: Secondary | ICD-10-CM

## 2015-03-31 DIAGNOSIS — R531 Weakness: Secondary | ICD-10-CM
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
## 2015-08-11 ENCOUNTER — Ambulatory Visit (INDEPENDENT_AMBULATORY_CARE_PROVIDER_SITE_OTHER): Payer: BLUE CROSS/BLUE SHIELD | Admitting: Certified Nurse Midwife

## 2015-08-11 ENCOUNTER — Encounter: Payer: Self-pay | Admitting: Certified Nurse Midwife

## 2015-08-11 VITALS — BP 120/64 | HR 74 | Resp 16 | Ht 63.75 in | Wt 119.0 lb

## 2015-08-11 DIAGNOSIS — A499 Bacterial infection, unspecified: Secondary | ICD-10-CM | POA: Diagnosis not present

## 2015-08-11 DIAGNOSIS — N760 Acute vaginitis: Secondary | ICD-10-CM | POA: Diagnosis not present

## 2015-08-11 DIAGNOSIS — B373 Candidiasis of vulva and vagina: Secondary | ICD-10-CM | POA: Diagnosis not present

## 2015-08-11 DIAGNOSIS — Z113 Encounter for screening for infections with a predominantly sexual mode of transmission: Secondary | ICD-10-CM

## 2015-08-11 DIAGNOSIS — B3731 Acute candidiasis of vulva and vagina: Secondary | ICD-10-CM

## 2015-08-11 DIAGNOSIS — B9689 Other specified bacterial agents as the cause of diseases classified elsewhere: Secondary | ICD-10-CM

## 2015-08-11 MED ORDER — METRONIDAZOLE 0.75 % VA GEL: VAGINAL | Status: DC

## 2015-08-11 MED ORDER — FLUCONAZOLE 150 MG PO TABS: ORAL_TABLET | ORAL | Status: DC

## 2015-08-11 NOTE — Progress Notes (Signed)
Reviewed personally.  M. Suzanne Breniya Goertzen, MD.  

## 2015-08-11 NOTE — Progress Notes (Signed)
19 yo old white female  g0p0 here with complaint of vaginal symptoms of itching, burning, and increase discharge. Describes discharge as clear scant,..Onset of symptoms 7 days ago. Denies new personal products or vaginal dryness. STD concerns would like GC,Chlamydia screening. Urinary symptoms none . Contraception is OCP/condoms.  O:Healthy female WDWN Affect: normal, orientation x 3  Exam: Abdomen:soft, non tender Inguinal Lymph node: no enlargement or tenderness Pelvic exam: External genital: normal female, no scaling or exudate noted BUS: negative Vagina: watery white odorous  discharge noted. Ph:4.5   ,Wet prep taken,  Gc, Chlamydia specimen taken Cervix: normal, non tender, no CMT Uterus: normal, non tender Adnexa:normal, non tender, no masses or fullness noted   Wet Prep results: positive for yeast and clue cells   A:Normal pelvic exam Yeast vaginitis BV   P:Discussed findings of yeast vaginitis and BV and etiology. Discussed Aveeno or baking soda sitz bath for comfort. Avoid moist clothes or pads for extended period of time. If working out in gym clothes  for long periods of time change underwear  Coconut Oil use for skin protection prior to activity can be used to external skin for protection or dryness. Rx: Metrogel see order Rx Diflucan see order Lab: GC,Chlamydia   Rv prn

## 2015-08-11 NOTE — Patient Instructions (Signed)
Monilial Vaginitis Vaginitis in a soreness, swelling and redness (inflammation) of the vagina and vulva. Monilial vaginitis is not a sexually transmitted infection. CAUSES  Yeast vaginitis is caused by yeast (candida) that is normally found in your vagina. With a yeast infection, the candida has overgrown in number to a point that upsets the chemical balance. SYMPTOMS   White, thick vaginal discharge.  Swelling, itching, redness and irritation of the vagina and possibly the lips of the vagina (vulva).  Burning or painful urination.  Painful intercourse. DIAGNOSIS  Things that may contribute to monilial vaginitis are:  Postmenopausal and virginal states.  Pregnancy.  Infections.  Being tired, sick or stressed, especially if you had monilial vaginitis in the past.  Diabetes. Good control will help lower the chance.  Birth control pills.  Tight fitting garments.  Using bubble bath, feminine sprays, douches or deodorant tampons.  Taking certain medications that kill germs (antibiotics).  Sporadic recurrence can occur if you become ill. TREATMENT  Your caregiver will give you medication.  There are several kinds of anti monilial vaginal creams and suppositories specific for monilial vaginitis. For recurrent yeast infections, use a suppository or cream in the vagina 2 times a week, or as directed.  Anti-monilial or steroid cream for the itching or irritation of the vulva may also be used. Get your caregiver's permission.  Painting the vagina with methylene blue solution may help if the monilial cream does not work.  Eating yogurt may help prevent monilial vaginitis. HOME CARE INSTRUCTIONS   Finish all medication as prescribed.  Do not have sex until treatment is completed or after your caregiver tells you it is okay.  Take warm sitz baths.  Do not douche.  Do not use tampons, especially scented ones.  Wear cotton underwear.  Avoid tight pants and panty  hose.  Tell your sexual partner that you have a yeast infection. They should go to their caregiver if they have symptoms such as mild rash or itching.  Your sexual partner should be treated as well if your infection is difficult to eliminate.  Practice safer sex. Use condoms.  Some vaginal medications cause latex condoms to fail. Vaginal medications that harm condoms are:  Cleocin cream.  Butoconazole (Femstat).  Terconazole (Terazol) vaginal suppository.  Miconazole (Monistat) (may be purchased over the counter). SEEK MEDICAL CARE IF:   You have a temperature by mouth above 102 F (38.9 C).  The infection is getting worse after 2 days of treatment.  The infection is not getting better after 3 days of treatment.  You develop blisters in or around your vagina.  You develop vaginal bleeding, and it is not your menstrual period.  You have pain when you urinate.  You develop intestinal problems.  You have pain with sexual intercourse.   This information is not intended to replace advice given to you by your health care provider. Make sure you discuss any questions you have with your health care provider.   Document Released: 05/15/2005 Document Revised: 10/28/2011 Document Reviewed: 02/06/2015 Elsevier Interactive Patient Education 2016 Elsevier Inc. Bacterial Vaginosis Bacterial vaginosis is a vaginal infection that occurs when the normal balance of bacteria in the vagina is disrupted. It results from an overgrowth of certain bacteria. This is the most common vaginal infection in women of childbearing age. Treatment is important to prevent complications, especially in pregnant women, as it can cause a premature delivery. CAUSES  Bacterial vaginosis is caused by an increase in harmful bacteria that are normally present   in smaller amounts in the vagina. Several different kinds of bacteria can cause bacterial vaginosis. However, the reason that the condition develops is not  fully understood. RISK FACTORS Certain activities or behaviors can put you at an increased risk of developing bacterial vaginosis, including:  Having a new sex partner or multiple sex partners.  Douching.  Using an intrauterine device (IUD) for contraception. Women do not get bacterial vaginosis from toilet seats, bedding, swimming pools, or contact with objects around them. SIGNS AND SYMPTOMS  Some women with bacterial vaginosis have no signs or symptoms. Common symptoms include:  Grey vaginal discharge.  A fishlike odor with discharge, especially after sexual intercourse.  Itching or burning of the vagina and vulva.  Burning or pain with urination. DIAGNOSIS  Your health care provider will take a medical history and examine the vagina for signs of bacterial vaginosis. A sample of vaginal fluid may be taken. Your health care provider will look at this sample under a microscope to check for bacteria and abnormal cells. A vaginal pH test may also be done.  TREATMENT  Bacterial vaginosis may be treated with antibiotic medicines. These may be given in the form of a pill or a vaginal cream. A second round of antibiotics may be prescribed if the condition comes back after treatment. Because bacterial vaginosis increases your risk for sexually transmitted diseases, getting treated can help reduce your risk for chlamydia, gonorrhea, HIV, and herpes. HOME CARE INSTRUCTIONS   Only take over-the-counter or prescription medicines as directed by your health care provider.  If antibiotic medicine was prescribed, take it as directed. Make sure you finish it even if you start to feel better.  Tell all sexual partners that you have a vaginal infection. They should see their health care provider and be treated if they have problems, such as a mild rash or itching.  During treatment, it is important that you follow these instructions:  Avoid sexual activity or use condoms correctly.  Do not  douche.  Avoid alcohol as directed by your health care provider.  Avoid breastfeeding as directed by your health care provider. SEEK MEDICAL CARE IF:   Your symptoms are not improving after 3 days of treatment.  You have increased discharge or pain.  You have a fever. MAKE SURE YOU:   Understand these instructions.  Will watch your condition.  Will get help right away if you are not doing well or get worse. FOR MORE INFORMATION  Centers for Disease Control and Prevention, Division of STD Prevention: www.cdc.gov/std American Sexual Health Association (ASHA): www.ashastd.org    This information is not intended to replace advice given to you by your health care provider. Make sure you discuss any questions you have with your health care provider.   Document Released: 08/05/2005 Document Revised: 08/26/2014 Document Reviewed: 03/17/2013 Elsevier Interactive Patient Education 2016 Elsevier Inc.  

## 2015-08-16 LAB — IPS N GONORRHOEA AND CHLAMYDIA BY PCR

## 2016-01-05 ENCOUNTER — Ambulatory Visit (INDEPENDENT_AMBULATORY_CARE_PROVIDER_SITE_OTHER): Payer: BLUE CROSS/BLUE SHIELD | Admitting: Certified Nurse Midwife

## 2016-01-05 ENCOUNTER — Encounter: Payer: Self-pay | Admitting: Certified Nurse Midwife

## 2016-01-05 VITALS — BP 100/62 | HR 68 | Resp 16 | Ht 63.75 in | Wt 123.0 lb

## 2016-01-05 DIAGNOSIS — A499 Bacterial infection, unspecified: Secondary | ICD-10-CM

## 2016-01-05 DIAGNOSIS — N760 Acute vaginitis: Secondary | ICD-10-CM

## 2016-01-05 DIAGNOSIS — B373 Candidiasis of vulva and vagina: Secondary | ICD-10-CM | POA: Diagnosis not present

## 2016-01-05 DIAGNOSIS — B9689 Other specified bacterial agents as the cause of diseases classified elsewhere: Secondary | ICD-10-CM

## 2016-01-05 DIAGNOSIS — B3731 Acute candidiasis of vulva and vagina: Secondary | ICD-10-CM

## 2016-01-05 MED ORDER — METRONIDAZOLE 0.75 % VA GEL: 1.0000 | Freq: Every day | VAGINAL | Status: DC

## 2016-01-05 MED ORDER — NYSTATIN-TRIAMCINOLONE 100000-0.1 UNIT/GM-% EX CREA: 1.0000 "application " | TOPICAL_CREAM | Freq: Two times a day (BID) | CUTANEOUS | Status: DC

## 2016-01-05 NOTE — Progress Notes (Signed)
19 y.o. Single Caucasian female G0P0 here with complaint of vaginal symptoms of itching, burning, and increase discharge. Describes discharge as watery green discharge with slight  Odor. Also noted slight pink ?rash in groin area, friend has HSV(patient had no contact with her)  And wants to make sure she does not have this. No known exposure. Denies blisters or pain in vulva area. Works outside daily and perspires a lot. Onset of symptoms 3 days ago. Denies new personal products .No STD concerns or partner change. Urinary symptoms none . Contraception is condoms.   O:Healthy female WDWN Affect: normal, orientation x 3  Exam: Abdomen:soft, non tender  Inguinal Lymph nodes: no enlargement or tenderness Pelvic exam: External genital: normal female, no lesions or blisters, slight increase pink in groin area with exudate wet prep taken BUS: negative Vagina: scant white watery discharge noted. Ph:5.0   ,Wet prep taken,   Cervix: normal, non tender, no CMT Uterus: normal, non tender Adnexa:normal, non tender, no masses or fullness noted   Wet Prep results: Positive for clue in vagina, positive for yeast external area   A:Normal pelvic exam Yeast dermatitis BV   P:Discussed findings of yeast dermatitis/BV and etiology. Discussed Aveeno or baking soda sitz bath for comfort. Avoid moist clothes or pads for extended period of time. If working out in gym clothes or swim suits for long periods of time change underwear or bottoms of swimsuit if possible. Coconut Oil use for skin protection prior to activity can be used to external skin for protection or dryness. Questions addressed. Discussed HSV and no evidence of HSV on genital area. Reassured. Discussed etiology of HSV, transmission and prevention. Questions addressed.  Rx: Mycolog cream see order with instructions Rx Metrogel see order with instructions  Rv prn

## 2016-01-05 NOTE — Patient Instructions (Signed)

## 2016-01-06 NOTE — Progress Notes (Signed)
Reviewed personally.  M. Suzanne Giavana Rooke, MD.  

## 2016-01-12 DIAGNOSIS — F411 Generalized anxiety disorder: Secondary | ICD-10-CM | POA: Diagnosis not present

## 2016-02-10 DIAGNOSIS — L049 Acute lymphadenitis, unspecified: Secondary | ICD-10-CM | POA: Diagnosis not present

## 2016-02-21 DIAGNOSIS — F411 Generalized anxiety disorder: Secondary | ICD-10-CM | POA: Diagnosis not present

## 2016-03-26 ENCOUNTER — Ambulatory Visit (INDEPENDENT_AMBULATORY_CARE_PROVIDER_SITE_OTHER): Payer: BLUE CROSS/BLUE SHIELD | Admitting: Nurse Practitioner

## 2016-03-26 ENCOUNTER — Encounter: Payer: Self-pay | Admitting: Nurse Practitioner

## 2016-03-26 VITALS — BP 114/66 | HR 64 | Ht 63.75 in | Wt 121.0 lb

## 2016-03-26 DIAGNOSIS — N760 Acute vaginitis: Secondary | ICD-10-CM

## 2016-03-26 DIAGNOSIS — Z01419 Encounter for gynecological examination (general) (routine) without abnormal findings: Secondary | ICD-10-CM

## 2016-03-26 DIAGNOSIS — Z0000 Encounter for general adult medical examination without abnormal findings: Secondary | ICD-10-CM

## 2016-03-26 DIAGNOSIS — N907 Vulvar cyst: Secondary | ICD-10-CM | POA: Diagnosis not present

## 2016-03-26 LAB — POCT URINALYSIS DIPSTICK
Bilirubin, UA: NEGATIVE
Blood, UA: NEGATIVE
Glucose, UA: NEGATIVE
Ketones, UA: NEGATIVE
Leukocytes, UA: NEGATIVE
Nitrite, UA: NEGATIVE
Protein, UA: NEGATIVE
Urobilinogen, UA: NEGATIVE
pH, UA: 5

## 2016-03-26 NOTE — Progress Notes (Signed)
Reviewed personally.  M. Suzanne Taygan Connell, MD.  

## 2016-03-26 NOTE — Patient Instructions (Signed)
General topics  Next pap or exam is  due in 1 year Take a Women's multivitamin Take 1200 mg. of calcium daily - prefer dietary If any concerns in interim to call back  Breast Self-Awareness Practicing breast self-awareness may pick up problems early, prevent significant medical complications, and possibly save your life. By practicing breast self-awareness, you can become familiar with how your breasts look and feel and if your breasts are changing. This allows you to notice changes early. It can also offer you some reassurance that your breast health is good. One way to learn what is normal for your breasts and whether your breasts are changing is to do a breast self-exam. If you find a lump or something that was not present in the past, it is best to contact your caregiver right away. Other findings that should be evaluated by your caregiver include nipple discharge, especially if it is bloody; skin changes or reddening; areas where the skin seems to be pulled in (retracted); or new lumps and bumps. Breast pain is seldom associated with cancer (malignancy), but should also be evaluated by a caregiver. BREAST SELF-EXAM The best time to examine your breasts is 5 7 days after your menstrual period is over.  ExitCare Patient Information 2013 [employer].   Exercise to Stay Healthy Exercise helps you become and stay healthy. EXERCISE IDEAS AND TIPS Choose exercises that:  You enjoy.  Fit into your day. You do not need to exercise really hard to be healthy. You can do exercises at a slow or medium level and stay healthy. You can:  Stretch before and after working out.  Try yoga, Pilates, or tai chi.  Lift weights.  Walk fast, swim, jog, run, climb stairs, bicycle, dance, or rollerskate.  Take aerobic classes. Exercises that burn about 150 calories:  Running 1  miles in 15 minutes.  Playing volleyball for 45 to 60 minutes.  Washing and waxing a car for 45 to 60  minutes.  Playing touch football for 45 minutes.  Walking 1  miles in 35 minutes.  Pushing a stroller 1  miles in 30 minutes.  Playing basketball for 30 minutes.  Raking leaves for 30 minutes.  Bicycling 5 miles in 30 minutes.  Walking 2 miles in 30 minutes.  Dancing for 30 minutes.  Shoveling snow for 15 minutes.  Swimming laps for 20 minutes.  Walking up stairs for 15 minutes.  Bicycling 4 miles in 15 minutes.  Gardening for 30 to 45 minutes.  Jumping rope for 15 minutes.  Washing windows or floors for 45 to 60 minutes. Document Released: 09/07/2010 Document Revised: 10/28/2011 Document Reviewed: 09/07/2010 ExitCare Patient Information 2013 [employer].   Other topics ( that may be useful information):    Sexually Transmitted Disease Sexually transmitted disease (STD) refers to any infection that is passed from person to person during sexual activity. This may happen by way of saliva, semen, blood, vaginal mucus, or urine. Common STDs include:  Gonorrhea.  Chlamydia.  Syphilis.  HIV/AIDS.  Genital herpes.  Hepatitis B and C.  Trichomonas.  Human papillomavirus (HPV).  Pubic lice. CAUSES  An STD may be spread by bacteria, virus, or parasite. A person can get an STD by:  Sexual intercourse with an infected person.  Sharing sex toys with an infected person.  Sharing needles with an infected person.  Having intimate contact with the genitals, mouth, or rectal areas of an infected person. SYMPTOMS  Some people may not have any symptoms, but   they can still pass the infection to others. Different STDs have different symptoms. Symptoms include:  Painful or bloody urination.  Pain in the pelvis, abdomen, vagina, anus, throat, or eyes.  Skin rash, itching, irritation, growths, or sores (lesions). These usually occur in the genital or anal area.  Abnormal vaginal discharge.  Penile discharge in men.  Soft, flesh-colored skin growths in the  genital or anal area.  Fever.  Pain or bleeding during sexual intercourse.  Swollen glands in the groin area.  Yellow skin and eyes (jaundice). This is seen with hepatitis. DIAGNOSIS  To make a diagnosis, your caregiver may:  Take a medical history.  Perform a physical exam.  Take a specimen (culture) to be examined.  Examine a sample of discharge under a microscope.  Perform blood test TREATMENT   Chlamydia, gonorrhea, trichomonas, and syphilis can be cured with antibiotic medicine.  Genital herpes, hepatitis, and HIV can be treated, but not cured, with prescribed medicines. The medicines will lessen the symptoms.  Genital warts from HPV can be treated with medicine or by freezing, burning (electrocautery), or surgery. Warts may come back.  HPV is a virus and cannot be cured with medicine or surgery.However, abnormal areas may be followed very closely by your caregiver and may be removed from the cervix, vagina, or vulva through office procedures or surgery. If your diagnosis is confirmed, your recent sexual partners need treatment. This is true even if they are symptom-free or have a negative culture or evaluation. They should not have sex until their caregiver says it is okay. HOME CARE INSTRUCTIONS  All sexual partners should be informed, tested, and treated for all STDs.  Take your antibiotics as directed. Finish them even if you start to feel better.  Only take over-the-counter or prescription medicines for pain, discomfort, or fever as directed by your caregiver.  Rest.  Eat a balanced diet and drink enough fluids to keep your urine clear or pale yellow.  Do not have sex until treatment is completed and you have followed up with your caregiver. STDs should be checked after treatment.  Keep all follow-up appointments, Pap tests, and blood tests as directed by your caregiver.  Only use latex condoms and water-soluble lubricants during sexual activity. Do not use  petroleum jelly or oils.  Avoid alcohol and illegal drugs.  Get vaccinated for HPV and hepatitis. If you have not received these vaccines in the past, talk to your caregiver about whether one or both might be right for you.  Avoid risky sex practices that can break the skin. The only way to avoid getting an STD is to avoid all sexual activity.Latex condoms and dental dams (for oral sex) will help lessen the risk of getting an STD, but will not completely eliminate the risk. SEEK MEDICAL CARE IF:   You have a fever.  You have any new or worsening symptoms. Document Released: 10/26/2002 Document Revised: 10/28/2011 Document Reviewed: 11/02/2010 [facility] Patient Information 2013 [employer].    Domestic Abuse You are being battered or abused if someone close to you hits, pushes, or physically hurts you in any way. You also are being abused if you are forced into activities. You are being sexually abused if you are forced to have sexual contact of any kind. You are being emotionally abused if you are made to feel worthless or if you are constantly threatened. It is important to remember that help is available. No one has the right to abuse you. PREVENTION OF FURTHER  ABUSE  Learn the warning signs of danger. This varies with situations but may include: the use of alcohol, threats, isolation from friends and family, or forced sexual contact. Leave if you feel that violence is going to occur.  If you are attacked or beaten, report it to the police so the abuse is documented. You do not have to press charges. The police can protect you while you or the attackers are leaving. Get the officer's name and badge number and a copy of the report.  Find someone you can trust and tell them what is happening to you: your caregiver, a nurse, clergy member, close friend or family member. Feeling ashamed is natural, but remember that you have done nothing wrong. No one deserves abuse. Document Released:  08/02/2000 Document Revised: 10/28/2011 Document Reviewed: 10/11/2010 ExitCare Patient Information 2013 [employer].    How Much is Too Much Alcohol? Drinking too much alcohol can cause injury, accidents, and health problems. These types of problems can include:   Car crashes.  Falls.  Family fighting (domestic violence).  Drowning.  Fights.  Injuries.  Burns.  Damage to certain organs.  Having a baby with birth defects. ONE DRINK CAN BE TOO MUCH WHEN YOU ARE:  Working.  Pregnant or breastfeeding.  Taking medicines. Ask your doctor.  Driving or planning to drive. If you or someone you know has a drinking problem, get help from a doctor.  Document Released: 06/01/2009 Document Revised: 10/28/2011 Document Reviewed: 06/01/2009 ExitCare Patient Information 2013 [employer].   Smoking Hazards Smoking cigarettes is extremely bad for your health. Tobacco smoke has over 200 known poisons in it. There are over 60 chemicals in tobacco smoke that cause cancer. Some of the chemicals found in cigarette smoke include:   Cyanide.  Benzene.  Formaldehyde.  Methanol (wood alcohol).  Acetylene (fuel used in welding torches).  Ammonia. Cigarette smoke also contains the poisonous gases nitrogen oxide and carbon monoxide.  Cigarette smokers have an increased risk of many serious medical problems and Smoking causes approximately:  90% of all lung cancer deaths in men.  80% of all lung cancer deaths in women.  90% of deaths from chronic obstructive lung disease. Compared with nonsmokers, smoking increases the risk of:  Coronary heart disease by 2 to 4 times.  Stroke by 2 to 4 times.  Men developing lung cancer by 23 times.  Women developing lung cancer by 13 times.  Dying from chronic obstructive lung diseases by 12 times.  . Smoking is the most preventable cause of death and disease in our society.  WHY IS SMOKING ADDICTIVE?  Nicotine is the chemical  agent in tobacco that is capable of causing addiction or dependence.  When you smoke and inhale, nicotine is absorbed rapidly into the bloodstream through your lungs. Nicotine absorbed through the lungs is capable of creating a powerful addiction. Both inhaled and non-inhaled nicotine may be addictive.  Addiction studies of cigarettes and spit tobacco show that addiction to nicotine occurs mainly during the teen years, when young people begin using tobacco products. WHAT ARE THE BENEFITS OF QUITTING?  There are many health benefits to quitting smoking.   Likelihood of developing cancer and heart disease decreases. Health improvements are seen almost immediately.  Blood pressure, pulse rate, and breathing patterns start returning to normal soon after quitting. QUITTING SMOKING   American Lung Association - 1-800-LUNGUSA  American Cancer Society - 1-800-ACS-2345 Document Released: 09/12/2004 Document Revised: 10/28/2011 Document Reviewed: 05/17/2009 ExitCare Patient Information 2013 ExitCare[employer]   LLC.   Stress Management Stress is a state of physical or mental tension that often results from changes in your life or normal routine. Some common causes of stress are:  Death of a loved one.  Injuries or severe illnesses.  Getting fired or changing jobs.  Moving into a new home. Other causes may be:  Sexual problems.  Business or financial losses.  Taking on a large debt.  Regular conflict with someone at home or at work.  Constant tiredness from lack of sleep. It is not just bad things that are stressful. It may be stressful to:  Win the lottery.  Get married.  Buy a new car. The amount of stress that can be easily tolerated varies from person to person. Changes generally cause stress, regardless of the types of change. Too much stress can affect your health. It may lead to physical or emotional problems. Too little stress (boredom) may also become stressful. SUGGESTIONS TO  REDUCE STRESS:  Talk things over with your family and friends. It often is helpful to share your concerns and worries. If you feel your problem is serious, you may want to get help from a professional counselor.  Consider your problems one at a time instead of lumping them all together. Trying to take care of everything at once may seem impossible. List all the things you need to do and then start with the most important one. Set a goal to accomplish 2 or 3 things each day. If you expect to do too many in a single day you will naturally fail, causing you to feel even more stressed.  Do not use alcohol or drugs to relieve stress. Although you may feel better for a short time, they do not remove the problems that caused the stress. They can also be habit forming.  Exercise regularly - at least 3 times per week. Physical exercise can help to relieve that "uptight" feeling and will relax you.  The shortest distance between despair and hope is often a good night's sleep.  Go to bed and get up on time allowing yourself time for appointments without being rushed.  Take a short "time-out" period from any stressful situation that occurs during the day. Close your eyes and take some deep breaths. Starting with the muscles in your face, tense them, hold it for a few seconds, then relax. Repeat this with the muscles in your neck, shoulders, hand, stomach, back and legs.  Take good care of yourself. Eat a balanced diet and get plenty of rest.  Schedule time for having fun. Take a break from your daily routine to relax. HOME CARE INSTRUCTIONS   Call if you feel overwhelmed by your problems and feel you can no longer manage them on your own.  Return immediately if you feel like hurting yourself or someone else. Document Released: 01/29/2001 Document Revised: 10/28/2011 Document Reviewed: 09/21/2007 ExitCare Patient Information 2013 [employer].  

## 2016-03-26 NOTE — Addendum Note (Signed)
Addended by: Roanna BanningGRUBB, Cammi Consalvo R on: 03/26/2016 05:24 PM   Modules accepted: Orders
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
## 2016-03-27 LAB — WET PREP BY MOLECULAR PROBE
Candida species: NEGATIVE
Gardnerella vaginalis: NEGATIVE
Trichomonas vaginosis: NEGATIVE

## 2016-03-28 LAB — HEMOGLOBIN, FINGERSTICK: Hemoglobin, fingerstick: 13.9 g/dL (ref 12.0–16.0)

## 2016-04-02 ENCOUNTER — Other Ambulatory Visit: Payer: Self-pay | Admitting: Obstetrics and Gynecology

## 2016-04-02 NOTE — Telephone Encounter (Signed)
Left message for patient to call -eh 

## 2016-04-02 NOTE — Telephone Encounter (Signed)
Please confirm with pt that she wants a refill.  Just here on 03/26/16 and did not want to restart OCP.

## 2016-04-02 NOTE — Telephone Encounter (Signed)
Medication refill request: OCP Last AEX:  03-26-16 Next AEX: not scheduled  Last MMG (if hormonal medication request): N/A Refill authorized: please advise

## 2016-04-03 NOTE — Telephone Encounter (Signed)
Left another message for patient to call regarding refill request -eh

## 2016-04-03 NOTE — Telephone Encounter (Signed)
Patient never called back to confirm RX request. Please advise

## 2016-04-05 NOTE — Telephone Encounter (Signed)
We can close the encounter .

## 2016-07-28 DIAGNOSIS — J09X2 Influenza due to identified novel influenza A virus with other respiratory manifestations: Secondary | ICD-10-CM | POA: Diagnosis not present

## 2016-10-17 ENCOUNTER — Emergency Department (HOSPITAL_COMMUNITY)
Admission: EM | Admit: 2016-10-17 | Discharge: 2016-10-18 | Disposition: A | Discharge status: Home / Self Care | Payer: BLUE CROSS/BLUE SHIELD | Attending: Emergency Medicine | Admitting: Emergency Medicine

## 2016-10-17 ENCOUNTER — Emergency Department (HOSPITAL_COMMUNITY): Payer: BLUE CROSS/BLUE SHIELD

## 2016-10-17 ENCOUNTER — Encounter (HOSPITAL_COMMUNITY): Payer: Self-pay | Admitting: *Deleted

## 2016-10-17 DIAGNOSIS — W501XXA Accidental kick by another person, initial encounter: Secondary | ICD-10-CM | POA: Insufficient documentation

## 2016-10-17 DIAGNOSIS — Z23 Encounter for immunization: Secondary | ICD-10-CM | POA: Diagnosis not present

## 2016-10-17 DIAGNOSIS — S069X1A Unspecified intracranial injury with loss of consciousness of 30 minutes or less, initial encounter: Secondary | ICD-10-CM | POA: Diagnosis not present

## 2016-10-17 DIAGNOSIS — S060X1A Concussion with loss of consciousness of 30 minutes or less, initial encounter: Secondary | ICD-10-CM | POA: Diagnosis not present

## 2016-10-17 DIAGNOSIS — S060XAA Concussion with loss of consciousness status unknown, initial encounter: Secondary | ICD-10-CM

## 2016-10-17 DIAGNOSIS — Y9289 Other specified places as the place of occurrence of the external cause: Secondary | ICD-10-CM | POA: Insufficient documentation

## 2016-10-17 DIAGNOSIS — S6992XA Unspecified injury of left wrist, hand and finger(s), initial encounter: Secondary | ICD-10-CM | POA: Diagnosis not present

## 2016-10-17 DIAGNOSIS — S0083XA Contusion of other part of head, initial encounter: Secondary | ICD-10-CM

## 2016-10-17 DIAGNOSIS — S025XXA Fracture of tooth (traumatic), initial encounter for closed fracture: Secondary | ICD-10-CM | POA: Diagnosis not present

## 2016-10-17 DIAGNOSIS — S069X9A Unspecified intracranial injury with loss of consciousness of unspecified duration, initial encounter: Secondary | ICD-10-CM

## 2016-10-17 DIAGNOSIS — Y999 Unspecified external cause status: Secondary | ICD-10-CM | POA: Diagnosis not present

## 2016-10-17 DIAGNOSIS — M25532 Pain in left wrist: Secondary | ICD-10-CM | POA: Diagnosis not present

## 2016-10-17 DIAGNOSIS — Y9389 Activity, other specified: Secondary | ICD-10-CM | POA: Insufficient documentation

## 2016-10-17 DIAGNOSIS — S060X9A Concussion with loss of consciousness of unspecified duration, initial encounter: Secondary | ICD-10-CM

## 2016-10-17 DIAGNOSIS — S0990XA Unspecified injury of head, initial encounter: Secondary | ICD-10-CM | POA: Diagnosis present

## 2016-10-17 DIAGNOSIS — R55 Syncope and collapse: Secondary | ICD-10-CM | POA: Diagnosis not present

## 2016-10-17 HISTORY — DX: Concussion with loss of consciousness status unknown, initial encounter: S06.0XAA

## 2016-10-17 HISTORY — DX: Concussion with loss of consciousness of unspecified duration, initial encounter: S06.0X9A

## 2016-10-17 NOTE — ED Notes (Signed)
Patient transported to X-ray 

## 2016-10-17 NOTE — ED Notes (Signed)
Pt given ice pack.  Pt also has left wrist pain
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
## 2016-10-18 ENCOUNTER — Encounter (HOSPITAL_COMMUNITY): Payer: Self-pay | Admitting: Emergency Medicine

## 2016-10-18 DIAGNOSIS — S025XXA Fracture of tooth (traumatic), initial encounter for closed fracture: Secondary | ICD-10-CM | POA: Diagnosis not present

## 2016-10-18 DIAGNOSIS — R55 Syncope and collapse: Secondary | ICD-10-CM | POA: Diagnosis not present

## 2016-10-18 MED ORDER — PROMETHAZINE HCL 25 MG PO TABS: 25.0000 mg | ORAL_TABLET | Freq: Four times a day (QID) | ORAL | 0 refills | Status: DC | PRN

## 2016-10-18 MED ORDER — IBUPROFEN 800 MG PO TABS
800.0000 mg | ORAL_TABLET | Freq: Once | ORAL | Status: AC
  Administered 2016-10-18: 800 mg via ORAL
  Filled 2016-10-18: qty 1

## 2016-10-18 MED ORDER — TETANUS-DIPHTH-ACELL PERTUSSIS 5-2.5-18.5 LF-MCG/0.5 IM SUSP
0.5000 mL | Freq: Once | INTRAMUSCULAR | Status: AC
  Administered 2016-10-18: 0.5 mL via INTRAMUSCULAR
  Filled 2016-10-18: qty 0.5

## 2016-10-18 MED ORDER — IBUPROFEN 800 MG PO TABS: 800.0000 mg | ORAL_TABLET | Freq: Three times a day (TID) | ORAL | 0 refills | Status: DC

## 2016-10-18 NOTE — Discharge Instructions (Signed)
Take tylenol or ibuprofen as needed for pain.  Get plenty of rest and avoid activities that requires brain stimulation to shorten course of concussive symptoms.  Follow up with your dentist for further management of dental injury.
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
## 2016-10-21 ENCOUNTER — Encounter: Payer: Self-pay | Admitting: Internal Medicine

## 2016-10-21 ENCOUNTER — Ambulatory Visit (INDEPENDENT_AMBULATORY_CARE_PROVIDER_SITE_OTHER): Payer: BLUE CROSS/BLUE SHIELD | Admitting: Internal Medicine

## 2016-10-21 VITALS — BP 120/60 | HR 58 | Ht 63.0 in | Wt 127.6 lb

## 2016-10-21 DIAGNOSIS — S0990XS Unspecified injury of head, sequela: Secondary | ICD-10-CM | POA: Diagnosis not present

## 2016-10-21 DIAGNOSIS — S0911XA Strain of muscle and tendon of head, initial encounter: Secondary | ICD-10-CM

## 2016-10-21 NOTE — Patient Instructions (Signed)
Expect continued improvement  Caution with ladders .   Contact  us if increasing headaches and weakness  Other concerns  It may  Take a while to get  The jaw    Injury sx to subside.  Soft diet and advance as tolerated .
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
## 2016-12-06 ENCOUNTER — Encounter: Payer: Self-pay | Admitting: Certified Nurse Midwife

## 2016-12-06 ENCOUNTER — Ambulatory Visit (INDEPENDENT_AMBULATORY_CARE_PROVIDER_SITE_OTHER): Payer: BLUE CROSS/BLUE SHIELD | Admitting: Certified Nurse Midwife

## 2016-12-06 VITALS — BP 104/64 | HR 68 | Resp 16 | Ht 63.75 in | Wt 128.0 lb

## 2016-12-06 DIAGNOSIS — Z113 Encounter for screening for infections with a predominantly sexual mode of transmission: Secondary | ICD-10-CM

## 2016-12-06 DIAGNOSIS — Z30015 Encounter for initial prescription of vaginal ring hormonal contraceptive: Secondary | ICD-10-CM

## 2016-12-06 MED ORDER — ETONOGESTREL-ETHINYL ESTRADIOL 0.12-0.015 MG/24HR VA RING: VAGINAL_RING | VAGINAL | 4 refills | Status: DC
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
## 2016-12-06 NOTE — Patient Instructions (Signed)
Ethinyl Estradiol; Etonogestrel vaginal ring What is this medicine? ETHINYL ESTRADIOL; ETONOGESTREL (ETH in il es tra DYE ole; et oh noe JES trel) vaginal ring is a flexible, vaginal ring used as a contraceptive (birth control method). This medicine combines two types of female hormones, an estrogen and a progestin. This ring is used to prevent ovulation and pregnancy. Each ring is effective for one month. This medicine may be used for other purposes; ask your health care provider or pharmacist if you have questions. COMMON BRAND NAME(S): NuvaRing What should I tell my health care provider before I take this medicine? They need to know if you have or ever had any of these conditions: -abnormal vaginal bleeding -blood vessel disease or blood clots -breast, cervical, endometrial, ovarian, liver, or uterine cancer -diabetes -gallbladder disease -heart disease or recent heart attack -high blood pressure -high cholesterol -kidney disease -liver disease -migraine headaches -stroke -systemic lupus erythematosus (SLE) -tobacco smoker -an unusual or allergic reaction to estrogens, progestins, other medicines, foods, dyes, or preservatives -pregnant or trying to get pregnant -breast-feeding How should I use this medicine? Insert the ring into your vagina as directed. Follow the directions on the prescription label. The ring will remain place for 3 weeks and is then removed for a 1-week break. A new ring is inserted 1 week after the last ring was removed, on the same day of the week. Check often to make sure the ring is still in place, especially before and after sexual intercourse. If the ring was out of the vagina for an unknown amount of time, you may not be protected from pregnancy. Perform a pregnancy test and call your doctor. Do not use more often than directed. A patient package insert for the product will be given with each prescription and refill. Read this sheet carefully each time. The  sheet may change frequently. Contact your pediatrician regarding the use of this medicine in children. Special care may be needed. This medicine has been used in female children who have started having menstrual periods. Overdosage: If you think you have taken too much of this medicine contact a poison control center or emergency room at once. NOTE: This medicine is only for you. Do not share this medicine with others. What if I miss a dose? You will need to replace your vaginal ring once a month as directed. If the ring should slip out, or if you leave it in longer or shorter than you should, contact your health care professional for advice. What may interact with this medicine? Do not take this medicine with the following medication: -dasabuvir; ombitasvir; paritaprevir; ritonavir -ombitasvir; paritaprevir; ritonavir This medicine may also interact with the following medications: -acetaminophen -antibiotics or medicines for infections, especially rifampin, rifabutin, rifapentine, and griseofulvin, and possibly penicillins or tetracyclines -aprepitant -ascorbic acid (vitamin C) -atorvastatin -barbiturate medicines, such as phenobarbital -bosentan -carbamazepine -caffeine -clofibrate -cyclosporine -dantrolene -doxercalciferol -felbamate -grapefruit juice -hydrocortisone -medicines for anxiety or sleeping problems, such as diazepam or temazepam -medicines for diabetes, including pioglitazone -modafinil -mycophenolate -nefazodone -oxcarbazepine -phenytoin -prednisolone -ritonavir or other medicines for HIV infection or AIDS -rosuvastatin -selegiline -soy isoflavones supplements -St. John's wort -tamoxifen or raloxifene -theophylline -thyroid hormones -topiramate -warfarin This list may not describe all possible interactions. Give your health care provider a list of all the medicines, herbs, non-prescription drugs, or dietary supplements you use. Also tell them if you smoke,  drink alcohol, or use illegal drugs. Some items may interact with your medicine. What should I watch for while using   this medicine? Visit your doctor or health care professional for regular checks on your progress. You will need a regular breast and pelvic exam and Pap smear while on this medicine. Use an additional method of contraception during the first cycle that you use this ring. Do not use a diaphragm or female condom, as the ring can interfere with these birth control methods and their proper placement. If you have any reason to think you are pregnant, stop using this medicine right away and contact your doctor or health care professional. If you are using this medicine for hormone related problems, it may take several cycles of use to see improvement in your condition. Smoking increases the risk of getting a blood clot or having a stroke while you are using hormonal birth control, especially if you are more than 22 years old. You are strongly advised not to smoke. This medicine can make your body retain fluid, making your fingers, hands, or ankles swell. Your blood pressure can go up. Contact your doctor or health care professional if you feel you are retaining fluid. This medicine can make you more sensitive to the sun. Keep out of the sun. If you cannot avoid being in the sun, wear protective clothing and use sunscreen. Do not use sun lamps or tanning beds/booths. If you wear contact lenses and notice visual changes, or if the lenses begin to feel uncomfortable, consult your eye care specialist. In some women, tenderness, swelling, or minor bleeding of the gums may occur. Notify your dentist if this happens. Brushing and flossing your teeth regularly may help limit this. See your dentist regularly and inform your dentist of the medicines you are taking. If you are going to have elective surgery, you may need to stop using this medicine before the surgery. Consult your health care professional  for advice. This medicine does not protect you against HIV infection (AIDS) or any other sexually transmitted diseases. What side effects may I notice from receiving this medicine? Side effects that you should report to your doctor or health care professional as soon as possible: -breast tissue changes or discharge -changes in vaginal bleeding during your period or between your periods -chest pain -coughing up blood -dizziness or fainting spells -headaches or migraines -leg, arm or groin pain -severe or sudden headaches -stomach pain (severe) -sudden shortness of breath -sudden loss of coordination, especially on one side of the body -speech problems -symptoms of vaginal infection like itching, irritation or unusual discharge -tenderness in the upper abdomen -vomiting -weakness or numbness in the arms or legs, especially on one side of the body -yellowing of the eyes or skin Side effects that usually do not require medical attention (report to your doctor or health care professional if they continue or are bothersome): -breakthrough bleeding and spotting that continues beyond the 3 initial cycles of pills -breast tenderness -mood changes, anxiety, depression, frustration, anger, or emotional outbursts -increased sensitivity to sun or ultraviolet light -nausea -skin rash, acne, or brown spots on the skin -weight gain (slight) This list may not describe all possible side effects. Call your doctor for medical advice about side effects. You may report side effects to FDA at 1-800-FDA-1088. Where should I keep my medicine? Keep out of the reach of children. Store at room temperature between 15 and 30 degrees C (59 and 86 degrees F) for up to 4 months. The product will expire after 4 months. Protect from light. Throw away any unused medicine after the expiration date. NOTE: This   sheet is a summary. It may not cover all possible information. If you have questions about this medicine, talk  to your doctor, pharmacist, or health care provider.  2018 Elsevier/Gold Standard (2016-04-12 17:00:31)  

## 2016-12-07 LAB — HEPATITIS C ANTIBODY: HCV Ab: NEGATIVE

## 2016-12-07 LAB — WET PREP BY MOLECULAR PROBE
Candida species: NOT DETECTED
Gardnerella vaginalis: DETECTED — AB
Trichomonas vaginosis: NOT DETECTED

## 2016-12-07 LAB — STD PANEL
HIV 1&2 Ab, 4th Generation: NONREACTIVE
Hepatitis B Surface Ag: NEGATIVE

## 2016-12-07 LAB — GC/CHLAMYDIA PROBE AMP
CT Probe RNA: NOT DETECTED
GC Probe RNA: NOT DETECTED

## 2016-12-07 NOTE — Progress Notes (Signed)
Encounter reviewed Jaquavian Firkus, MD   

## 2016-12-09 ENCOUNTER — Telehealth: Payer: Self-pay

## 2016-12-09 MED ORDER — METRONIDAZOLE 0.75 % VA GEL: 1.0000 | Freq: Two times a day (BID) | VAGINAL | 0 refills | Status: AC

## 2016-12-09 NOTE — Telephone Encounter (Signed)
-----   Message from Ria Comment, FNP sent at 12/09/2016 12:44 PM EDT ----- Please let Hannah Pham's pt know that STD panel with HIV, Hep B, Hep C, STS, GC and chlamydia is all normal.  The vaginal wet prep is positive for BV.  She may have Metrogel or Flagyl - her choice.  Caution with GI and ETOH precautions.

## 2016-12-09 NOTE — Telephone Encounter (Signed)
Patient informed of results. Patient chose to do Metrogel. Precautions given. Patient verbalized understanding and agreement.

## 2016-12-09 NOTE — Telephone Encounter (Signed)
Left message for patient to call Summer back.  

## 2016-12-09 NOTE — Addendum Note (Signed)
Addended by: Michell Heinrich D on: 12/09/2016 02:54 PM   Modules accepted: Orders

## 2016-12-09 NOTE — Telephone Encounter (Signed)
Return call to Summer.

## 2017-01-08 ENCOUNTER — Ambulatory Visit (INDEPENDENT_AMBULATORY_CARE_PROVIDER_SITE_OTHER): Payer: BLUE CROSS/BLUE SHIELD | Admitting: Certified Nurse Midwife

## 2017-01-08 ENCOUNTER — Encounter: Payer: Self-pay | Admitting: Certified Nurse Midwife

## 2017-01-08 VITALS — BP 102/68 | HR 64 | Resp 16 | Ht 63.75 in | Wt 127.0 lb

## 2017-01-08 DIAGNOSIS — B373 Candidiasis of vulva and vagina: Secondary | ICD-10-CM | POA: Diagnosis not present

## 2017-01-08 DIAGNOSIS — B3731 Acute candidiasis of vulva and vagina: Secondary | ICD-10-CM

## 2017-01-08 MED ORDER — TERCONAZOLE 0.4 % VA CREA: 1.0000 | TOPICAL_CREAM | Freq: Every day | VAGINAL | 0 refills | Status: DC

## 2017-01-08 NOTE — Progress Notes (Signed)
21 y.o. Single Caucasian female G0P0000 here with complaint of vaginal symptoms of itching,  and increase discharge. Describes discharge as white with no odor. Onset of symptoms 1-2 weeksago. Denies new personal products. Has noted vaginal dryness with itching and slight increase discharge and no odor when she started using Nuvaring, and this feels different. No  STD concerns, recent screening with aex all negative. Urinary symptoms no burning or frequency, just feels irritated. . Contraception is Nuvaring  ROS Pertinent to HPI  O:Healthy female WDWN Affect: normal, orientation x 3  Exam: Skin: warm and dry Abdomen:soft, non tender  Inguinal Lymph nodes: no enlargement or tenderness Pelvic exam: External genital: normal female, no redness or tenderness or lesions BUS: negative Vagina: white non odorous discharge noted. Ph:4.0   ,Wet prep taken, Nuvaring noted in place Cervix: normal, non tender, no CMT Uterus: normal, non tender Adnexa:normal, non tender, no masses or fullness noted   Wet Prep results: KOH,Saline + for yeast   A:Normal pelvic exam Yeast vaginitis Contraception Nuvaring   P:Discussed findings of yeast vaginitis and etiology. Discussed Aveeno or baking soda sitz bath for comfort. Avoid moist clothes for extended period of time. If working out in gym clothes or swim suits for long periods of time change underwear or bottoms of swimsuit if possible. Coconut Oil use for skin protection prior to activity can be used to external skin for protection or dryness. Rx: Terazol 7 vaginal cream see order with instructions  Rv prn

## 2017-01-08 NOTE — Patient Instructions (Signed)

## 2017-02-05 DIAGNOSIS — F411 Generalized anxiety disorder: Secondary | ICD-10-CM | POA: Diagnosis not present

## 2017-04-20 ENCOUNTER — Other Ambulatory Visit: Payer: Self-pay | Admitting: Certified Nurse Midwife

## 2017-04-20 DIAGNOSIS — Z30015 Encounter for initial prescription of vaginal ring hormonal contraceptive: Secondary | ICD-10-CM

## 2017-04-22 NOTE — Telephone Encounter (Signed)
Will renew x 1 needs to schedule aex

## 2017-04-22 NOTE — Telephone Encounter (Signed)
Medication refill request: Nuvaring  Last AEX:  03-26-16 Last OV: 12-06-16  Next AEX: not scheduled  Last MMG (if hormonal medication request): N/A Refill authorized: please advise

## 2017-04-22 NOTE — Telephone Encounter (Signed)
Left detailed message for patient to call and schedule AEX [facility] -eh

## 2017-04-28 ENCOUNTER — Other Ambulatory Visit (HOSPITAL_COMMUNITY)
Admission: RE | Admit: 2017-04-28 | Discharge: 2017-04-28 | Disposition: A | Discharge status: Home / Self Care | Payer: BLUE CROSS/BLUE SHIELD | Source: Ambulatory Visit | Attending: Obstetrics & Gynecology | Admitting: Obstetrics & Gynecology

## 2017-04-28 ENCOUNTER — Ambulatory Visit (INDEPENDENT_AMBULATORY_CARE_PROVIDER_SITE_OTHER): Payer: BLUE CROSS/BLUE SHIELD | Admitting: Obstetrics & Gynecology

## 2017-04-28 ENCOUNTER — Encounter: Payer: Self-pay | Admitting: Obstetrics & Gynecology

## 2017-04-28 VITALS — BP 102/60 | HR 64 | Resp 16 | Ht 64.25 in | Wt 122.0 lb

## 2017-04-28 DIAGNOSIS — Z01419 Encounter for gynecological examination (general) (routine) without abnormal findings: Secondary | ICD-10-CM

## 2017-04-28 DIAGNOSIS — F429 Obsessive-compulsive disorder, unspecified: Secondary | ICD-10-CM | POA: Insufficient documentation

## 2017-04-28 DIAGNOSIS — N898 Other specified noninflammatory disorders of vagina: Secondary | ICD-10-CM | POA: Diagnosis not present

## 2017-04-28 DIAGNOSIS — Z124 Encounter for screening for malignant neoplasm of cervix: Secondary | ICD-10-CM

## 2017-04-28 DIAGNOSIS — Z30015 Encounter for initial prescription of vaginal ring hormonal contraceptive: Secondary | ICD-10-CM | POA: Diagnosis not present

## 2017-04-28 MED ORDER — ETONOGESTREL-ETHINYL ESTRADIOL 0.12-0.015 MG/24HR VA RING: VAGINAL_RING | VAGINAL | 4 refills | Status: DC

## 2017-04-28 MED ORDER — CLOBETASOL PROPIONATE 0.05 % EX OINT: 1.0000 "application " | TOPICAL_OINTMENT | Freq: Two times a day (BID) | CUTANEOUS | 0 refills | Status: DC
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
## 2017-04-29 LAB — CYTOLOGY - PAP: Diagnosis: NEGATIVE

## 2017-08-07 ENCOUNTER — Telehealth: Payer: Self-pay | Admitting: Obstetrics & Gynecology

## 2017-08-07 NOTE — Telephone Encounter (Signed)
Spoke with patient. Requesting OV for vaginal irritation and "rash". Describes red, itchy patches on vulva and labia. Brown, chunky vaginal d/c. States this has been an ongoing problem, felt initially may be r/t nuvaring, stopped 1 month ago, problem has not resolved.   LMP 12/17  Has not been SA since April 2018.   Patient declined OV for today with Leota Sauerseborah Leonard, CNM, scheduled for 12/21 at 8:15am with Leota Sauerseborah Leonard, CNM. Patient is agreeable to date and time.   Last AEX with Dr. Hyacinth MeekerMiller 04/28/17  Routing to provider for final review. Patient is agreeable to disposition. Will close encounter.   Cc: Dr. Hyacinth MeekerMiller

## 2017-08-07 NOTE — Telephone Encounter (Signed)
Patient has a rash and vaginal irritation. If calling after 4 pm, its ok to leave a detailed message.

## 2017-08-08 ENCOUNTER — Ambulatory Visit: Payer: BLUE CROSS/BLUE SHIELD | Admitting: Certified Nurse Midwife

## 2017-08-08 ENCOUNTER — Other Ambulatory Visit: Payer: Self-pay

## 2017-08-08 ENCOUNTER — Encounter: Payer: Self-pay | Admitting: Certified Nurse Midwife

## 2017-08-08 VITALS — BP 116/60 | HR 84 | Resp 16 | Wt 130.0 lb

## 2017-08-08 DIAGNOSIS — N898 Other specified noninflammatory disorders of vagina: Secondary | ICD-10-CM

## 2017-08-08 NOTE — Patient Instructions (Signed)
Pruritus  Pruritus is an itching feeling. There are many different conditions and factors that can make your skin itchy. Dry skin is one of the most common causes of itching. Most cases of itching do not require medical attention. Itchy skin can turn into a rash.  Follow these instructions at home:  Watch your pruritus for any changes. Take these steps to help with your condition:  Skin Care  · Moisturize your skin as needed. A moisturizer that contains petroleum jelly is best for keeping moisture in your skin.  · Take or apply medicines only as directed by your health care provider. This may include:  ? Corticosteroid cream.  ? Anti-itch lotions.  ? Oral anti-histamines.  · Apply cool compresses to the affected areas.  · Try taking a bath with:  ? Epsom salts. Follow the instructions on the packaging. You can get these at your local pharmacy or grocery store.  ? Baking soda. Pour a small amount into the bath as directed by your health care provider.  ? Colloidal oatmeal. Follow the instructions on the packaging. You can get this at your local pharmacy or grocery store.  · Try applying baking soda paste to your skin. Stir water into baking soda until it reaches a paste-like consistency.  · Do not scratch your skin.  · Avoid hot showers or baths, which can make itching worse. A cold shower may help with itching as long as you use a moisturizer after.  · Avoid scented soaps, detergents, and perfumes. Use gentle soaps, detergents, perfumes, and other cosmetic products.  General instructions  · Avoid wearing tight clothes.  · Keep a journal to help track what causes your itch. Write down:  ? What you eat.  ? What cosmetic products you use.  ? What you drink.  ? What you wear. This includes jewelry.  · Use a humidifier. This keeps the air moist, which helps to prevent dry skin.  Contact a health care provider if:  · The itching does not go away after several days.  · You sweat at night.  · You have weight loss.  · You  are unusually thirsty.  · You urinate more than normal.  · You are more tired than normal.  · You have abdominal pain.  · Your skin tingles.  · You feel weak.  · Your skin or the whites of your eyes look yellow (jaundice).  · Your skin feels numb.  This information is not intended to replace advice given to you by your health care provider. Make sure you discuss any questions you have with your health care provider.  Document Released: 04/17/2011 Document Revised: 01/11/2016 Document Reviewed: 08/01/2014  Elsevier Interactive Patient Education © 2018 Elsevier Inc[employer]

## 2017-08-08 NOTE — Progress Notes (Signed)
21 y.o. Single Caucasian female G0P0000 here with complaint of vaginal symptoms of itching, burning, and increase discharge. Describes discharge as brown, thick. Onset of symptoms since April 2018. Denies new personal products or vaginal dryness. Patient has continued to have itching external . Has relief with cleaning with Dove soap. No STD concerns. No Urinary symptoms. Contraception is not sexually active and stopped Nuvaring use. No history herpes.   ROS pertinent to above  O:Healthy female WDWN Affect: normal, orientation x 3  Exam: Skin warm and dry Abdomen:soft, non tender  Inguinal Lymph nodes: no enlargement or tenderness Pelvic exam: External genital: normal female, no scaling or exudate. Appears dry. BUS: negative Vagina: period present , moderate amount Affirm taken Cervix: normal, non tender, no CMT Uterus: normal, non tender Adnexa:normal, non tender, no masses or fullness noted   A:Normal pelvic exam R/O vaginal infection Vaginal external dryness   P:Discussed findings of normal pelvic exam. Discussed unable to assess for infection due to bleeding. Will treat if affirm indicates.   Discussed Aveeno or baking soda sitz bath for comfort. . Coconut Oil use for skin protection prior to activity can be used to external skin for protection or dryness. Discussed Hydrocortisone 1% cream OTC for prn use external itching. Avoid long term use due to thinning of skin. Questions addressed. Lab: affirm  Rv prn

## 2017-08-09 LAB — VAGINITIS/VAGINOSIS, DNA PROBE
Candida Species: NEGATIVE
Gardnerella vaginalis: POSITIVE — AB
Trichomonas vaginosis: NEGATIVE

## 2017-08-13 ENCOUNTER — Other Ambulatory Visit: Payer: Self-pay

## 2017-08-13 MED ORDER — METRONIDAZOLE 500 MG PO TABS: 500.0000 mg | ORAL_TABLET | Freq: Two times a day (BID) | ORAL | 0 refills | Status: DC

## 2017-08-19 DIAGNOSIS — B009 Herpesviral infection, unspecified: Secondary | ICD-10-CM

## 2017-08-19 HISTORY — DX: Herpesviral infection, unspecified: B00.9

## 2018-04-01 ENCOUNTER — Other Ambulatory Visit: Payer: Self-pay

## 2018-04-01 ENCOUNTER — Ambulatory Visit: Payer: BLUE CROSS/BLUE SHIELD | Admitting: Certified Nurse Midwife

## 2018-04-01 ENCOUNTER — Encounter: Payer: Self-pay | Admitting: Certified Nurse Midwife

## 2018-04-01 VITALS — BP 110/68 | HR 64 | Resp 16 | Ht 63.75 in | Wt 123.0 lb

## 2018-04-01 DIAGNOSIS — Z113 Encounter for screening for infections with a predominantly sexual mode of transmission: Secondary | ICD-10-CM | POA: Diagnosis not present

## 2018-04-01 DIAGNOSIS — N930 Postcoital and contact bleeding: Secondary | ICD-10-CM | POA: Diagnosis not present

## 2018-04-01 DIAGNOSIS — Z01419 Encounter for gynecological examination (general) (routine) without abnormal findings: Secondary | ICD-10-CM | POA: Diagnosis not present

## 2018-04-01 NOTE — Progress Notes (Signed)
  Subjective:     Patient ID: Hannah Pham, female   DOB: 1995/08/25, 22 y.o.   MRN: [id number]   22 yo white female g0p0 here with complaint of vaginal bleeding with sexual activity with last partner with pain. New partner no pain, but pink discharge. Hand stimulation occurred with previous partner lack of lubrication also.Marland Kitchen. Desires STD screening. Denies vaginal itching, burning or increase in discharge or pain today. No urinary symptoms today. Contraception condoms consistent use. No other health issues today.    Review of Systems  Constitutional: Negative.   HENT: Negative.   Eyes: Negative.   Respiratory: Negative.   Cardiovascular: Negative.   Gastrointestinal: Negative.   Endocrine: Negative.   Genitourinary:       Pain or bleeding with intercourse  Musculoskeletal: Negative.   Skin: Negative.   Allergic/Immunologic: Negative.   Neurological: Negative.   Hematological: Negative.   Psychiatric/Behavioral: Negative.        Objective:   Physical Exam  Constitutional: She is oriented to person, place, and time. She appears well-developed and well-nourished.  Abdominal: Soft. She exhibits no mass. There is no tenderness. There is no guarding.  Genitourinary: Rectum normal and uterus normal. Pelvic exam was performed with patient supine. There is no rash, tenderness, lesion or injury on the right labia. There is no rash, tenderness, lesion or injury on the left labia. Uterus is not enlarged and not tender. Cervix exhibits no motion tenderness, no discharge and no friability. Right adnexum displays no mass, no tenderness and no fullness. Left adnexum displays no mass, no tenderness and no fullness. No tenderness or bleeding in the vagina. No signs of injury around the vagina. Vaginal discharge found.  Genitourinary Comments: Brown discharge no odor,no active bleeding noted from cervix or vaginal area. No lacerations noted. Non tender Specimens obtained  Lymphadenopathy: No inguinal  adenopathy noted on the right or left side.  Neurological: She is alert and oriented to person, place, and time.  Skin: Skin is warm and dry.  Vitals reviewed.      Assessment:     Normal pelvic exam Suspect overstimulation of cervix with fingers, causing bleeding and pain. R/O STD and vaginal infection Contraception condoms consistent    Plan:     Discussed finding with patient and need to limit finger or hand stimulation to avoid injury or pain. Questions addressed. Lab: Affirm, Gc/Chlamydia,STD panel, Hep C Stressed condom use and repeat screening in 6 months.  Rv prn, aex

## 2018-04-02 LAB — VAGINITIS/VAGINOSIS, DNA PROBE
Candida Species: NEGATIVE
Gardnerella vaginalis: NEGATIVE
Trichomonas vaginosis: NEGATIVE

## 2018-04-02 LAB — GC/CHLAMYDIA PROBE AMP
Chlamydia trachomatis, NAA: NEGATIVE
Neisseria gonorrhoeae by PCR: NEGATIVE

## 2018-04-03 ENCOUNTER — Telehealth: Payer: Self-pay | Admitting: *Deleted

## 2018-04-03 LAB — HEP, RPR, HIV PANEL
HIV Screen 4th Generation wRfx: NONREACTIVE
Hepatitis B Surface Ag: NEGATIVE
RPR Ser Ql: NONREACTIVE

## 2018-04-03 LAB — HEPATITIS C ANTIBODY: Hep C Virus Ab: <0.1 s/co ratio (ref 0.0–0.9)

## 2018-04-03 MED ORDER — METRONIDAZOLE 0.75 % VA GEL: 1.0000 | Freq: Every day | VAGINAL | 0 refills | Status: AC

## 2018-04-03 NOTE — Telephone Encounter (Signed)
Call to patient. All results reviewed with patient and she verbalized understanding. Patient requesting metrogel. Patient states she has used before. ETOH precautions reviewed. Pharmacy on file confirmed. Rx for metrogel sent to pharmacy. Patient agreeable.   Routing to provider for final review. Patient agreeable to disposition. Will close encounter.

## 2018-04-03 NOTE — Telephone Encounter (Signed)
-----   Message from Patton SallesBrook E Amundson C Silva, MD sent at 04/03/2018  4:59 AM EDT ----- This is Dr. Edward JollySilva reviewing lab results for Hannah Pham while she is out of the office.   Please inform of bacterial vaginosis. She may treat with Flagyl 500 mg po bid for 7 days or Metrogel pv at hs for 5 nights.  Please send Rx to pharmacy of choice. ETOH precautions.   She tested negative for yeast.  Her testing for infectious disease is all negative for trichomonas, HIV, syphilis, hepatitis B and C, gonorrhea, and chlamydia.
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
## 2018-08-14 ENCOUNTER — Other Ambulatory Visit (HOSPITAL_COMMUNITY)
Admission: RE | Admit: 2018-08-14 | Discharge: 2018-08-14 | Disposition: A | Discharge status: Home / Self Care | Payer: BLUE CROSS/BLUE SHIELD | Source: Ambulatory Visit | Attending: Obstetrics and Gynecology | Admitting: Obstetrics and Gynecology

## 2018-08-14 ENCOUNTER — Other Ambulatory Visit: Payer: Self-pay

## 2018-08-14 ENCOUNTER — Ambulatory Visit: Payer: BLUE CROSS/BLUE SHIELD | Admitting: Obstetrics and Gynecology

## 2018-08-14 ENCOUNTER — Encounter: Payer: Self-pay | Admitting: Obstetrics and Gynecology

## 2018-08-14 VITALS — BP 112/74 | HR 90 | Ht 63.75 in | Wt 123.2 lb

## 2018-08-14 DIAGNOSIS — N760 Acute vaginitis: Secondary | ICD-10-CM | POA: Diagnosis not present

## 2018-08-14 DIAGNOSIS — Z113 Encounter for screening for infections with a predominantly sexual mode of transmission: Secondary | ICD-10-CM

## 2018-08-14 DIAGNOSIS — R300 Dysuria: Secondary | ICD-10-CM | POA: Diagnosis not present

## 2018-08-14 DIAGNOSIS — R8290 Unspecified abnormal findings in urine: Secondary | ICD-10-CM

## 2018-08-14 DIAGNOSIS — N766 Ulceration of vulva: Secondary | ICD-10-CM

## 2018-08-14 LAB — POCT URINALYSIS DIPSTICK
Bilirubin, UA: NEGATIVE
Blood, UA: NEGATIVE
Glucose, UA: NEGATIVE
Ketones, UA: NEGATIVE
Nitrite, UA: NEGATIVE
Protein, UA: NEGATIVE
Urobilinogen, UA: 0.2 E.U./dL
pH, UA: 5 (ref 5.0–8.0)

## 2018-08-14 MED ORDER — VALACYCLOVIR HCL 1 G PO TABS: 1000.0000 mg | ORAL_TABLET | Freq: Two times a day (BID) | ORAL | 3 refills | Status: DC

## 2018-08-14 MED ORDER — LIDOCAINE 5 % EX OINT: 1.0000 "application " | TOPICAL_OINTMENT | Freq: Three times a day (TID) | CUTANEOUS | 1 refills | Status: DC

## 2018-08-14 NOTE — Patient Instructions (Signed)
Genital Herpes °Genital herpes is a common sexually transmitted infection (STI) that is caused by a virus. The virus spreads from person to person through sexual contact. Infection can cause itching, blisters, and sores around the genitals or rectum. Symptoms may last several days and then go away This is called an outbreak. However, the virus remains in your body, so you may have more outbreaks in the future. The time between outbreaks varies and can be months or years. °Genital herpes affects men and women. It is particularly concerning for pregnant women because the virus can be passed to the baby during delivery and can cause serious problems. Genital herpes is also a concern for people who have a weak disease-fighting (immune) system. °What are the causes? °This condition is caused by the herpes simplex virus (HSV) type 1 or type 2. The virus may spread through: °· Sexual contact with an infected person, including vaginal, anal, and oral sex. °· Contact with fluid from a herpes sore. °· The skin. This means that you can get herpes from an infected partner even if he or she does not have a visible sore or does not know that he or she is infected. °What increases the risk? °You are more likely to develop this condition if: °· You have sex with many partners. °· You do not use latex condoms during sex. °What are the signs or symptoms? °Most people do not have symptoms (asymptomatic) or have mild symptoms that may be mistaken for other skin problems. Symptoms may include: °· Small red bumps near the genitals, rectum, or mouth. These bumps turn into blisters and then turn into sores. °· Flu-like symptoms, including: °? Fever. °? Body aches. °? Swollen lymph nodes. °? Headache. °· Painful urination. °· Pain and itching in the genital area or rectal area. °· Vaginal discharge. °· Tingling or shooting pain in the legs and buttocks. °Generally, symptoms are more severe and last longer during the first (primary)  outbreak. Flu-like symptoms are also more common during the primary outbreak. °How is this diagnosed? °Genital herpes may be diagnosed based on: °· A physical exam. °· Your medical history. °· Blood tests. °· A test of a fluid sample (culture) from an open sore. °How is this treated? °There is no cure for this condition, but treatment with antiviral medicines that are taken by mouth (orally) can do the following: °· Speed up healing and relieve symptoms. °· Help to reduce the spread of the virus to sexual partners. °· Limit the chance of future outbreaks, or make future outbreaks shorter. °· Lessen symptoms of future outbreaks. °Your health care provider may also recommend pain relief medicines, such as aspirin or ibuprofen. °Follow these instructions at home: °Sexual activity °· Do not have sexual contact during active outbreaks. °· Practice safe sex. Latex condoms and female condoms may help prevent the spread of the herpes virus. °General instructions °· Keep the affected areas dry and clean. °· Take over-the-counter and prescription medicines only as told by your health care provider. °· Avoid rubbing or touching blisters and sores. If you do touch blisters or sores: °? Wash your hands thoroughly with soap and water. °? Do not touch your eyes afterward. °· To help relieve pain or itching, you may take the following actions as directed by your health care provider: °? Apply a cold, wet cloth (cold compress) to affected areas 4-6 times a day. °? Apply a substance that protects your skin and reduces bleeding (astringent). °? Apply a gel that   helps relieve pain around sores (lidocaine gel). °? Take a warm, shallow bath that cleans the genital area (sitz bath). °· Keep all follow-up visits as told by your health care provider. This is important. °How is this prevented? °· Use condoms. Although anyone can get genital herpes during sexual contact, even with the use of a condom, a condom can provide some  protection. °· Avoid having multiple sexual partners. °· Talk with your sexual partner about any symptoms either of you may have. Also, talk with your partner about any history of STIs. °· Get tested for STIs before you have sex. Ask your partner to do the same. °· Do not have sexual contact if you have symptoms of genital herpes. °Contact a health care provider if: °· Your symptoms are not improving with medicine. °· Your symptoms return. °· You have new symptoms. °· You have a fever. °· You have abdominal pain. °· You have redness, swelling, or pain in your eye. °· You notice new sores on other parts of your body. °· You are a woman and experience bleeding between menstrual periods. °· You have had herpes and you become pregnant or plan to become pregnant. °Summary °· Genital herpes is a common sexually transmitted infection (STI) that is caused by the herpes simplex virus (HSV) type 1 or type 2. °· These viruses are most often spread through sexual contact with an infected person. °· You are more likely to develop this condition if you have sex with many partners or you have unprotected sex. °· Most people do not have symptoms (asymptomatic) or have mild symptoms that may be mistaken for other skin problems. Symptoms occur as outbreaks that may happen months or years apart. °· There is no cure for this condition, but treatment with oral antiviral medicines can reduce symptoms, reduce the chance of spreading the virus to a partner, prevent future outbreaks, or shorten future outbreaks. °This information is not intended to replace advice given to you by your health care provider. Make sure you discuss any questions you have with your health care provider. °Document Released: 08/02/2000 Document Revised: 07/05/2016 Document Reviewed: 07/05/2016 °Elsevier Interactive Patient Education © 2019 [employer] Inc[employer] ° °

## 2018-08-14 NOTE — Addendum Note (Signed)
Addended by: Ardell IsaacsAMUNDSON C SILVA, BROOK E on: 08/14/2018 10:02 AM   Modules accepted: Orders

## 2018-08-15 LAB — HEP, RPR, HIV PANEL
HIV Screen 4th Generation wRfx: NONREACTIVE
Hepatitis B Surface Ag: NEGATIVE
RPR Ser Ql: NONREACTIVE

## 2018-08-15 LAB — URINE CULTURE

## 2018-08-15 LAB — HEPATITIS C ANTIBODY: Hep C Virus Ab: <0.1 s/co ratio (ref 0.0–0.9)

## 2018-08-16 LAB — HSV NAA
HSV 1 NAA: POSITIVE — AB
HSV 2 NAA: NEGATIVE

## 2018-08-17 ENCOUNTER — Encounter: Payer: Self-pay | Admitting: Obstetrics and Gynecology

## 2018-08-17 LAB — CERVICOVAGINAL ANCILLARY ONLY
Bacterial vaginitis: NEGATIVE
Candida vaginitis: NEGATIVE
Chlamydia: NEGATIVE
Neisseria Gonorrhea: NEGATIVE
Trichomonas: NEGATIVE
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
## 2018-08-20 ENCOUNTER — Encounter: Payer: Self-pay | Admitting: Obstetrics and Gynecology

## 2018-08-20 ENCOUNTER — Other Ambulatory Visit: Payer: Self-pay

## 2018-08-20 ENCOUNTER — Ambulatory Visit: Payer: BLUE CROSS/BLUE SHIELD | Admitting: Obstetrics and Gynecology

## 2018-08-20 VITALS — BP 102/64 | HR 68 | Ht 63.75 in | Wt 123.0 lb

## 2018-08-20 DIAGNOSIS — B009 Herpesviral infection, unspecified: Secondary | ICD-10-CM | POA: Diagnosis not present

## 2018-08-20 MED ORDER — VALACYCLOVIR HCL 500 MG PO TABS: ORAL_TABLET | ORAL | 3 refills | Status: DC

## 2018-08-25 LAB — HSV(HERPES SIMPLEX VRS) I + II AB-IGG
HSV 1 Glycoprotein G Ab, IgG: 20.2 index — ABNORMAL HIGH (ref 0.00–0.90)
HSV 2 IgG, Type Spec: <0.91 index (ref 0.00–0.90)

## 2018-08-25 LAB — HSV(HERPES SIMPLEX VRS) I + II AB-IGM: HSVI/II Comb IgM: 3.05 Ratio — ABNORMAL HIGH (ref 0.00–0.90)
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
## 2019-01-11 IMAGING — CR DG WRIST COMPLETE 3+V*L*
4 series · 4 of 4 positions shown · non-contrast
Comparison: None.

CLINICAL DATA: Hyperflexed wrist during fall. Complaints of wrist
pain.

EXAM:
LEFT WRIST - COMPLETE 3+ VIEW

[x wrist pa left]
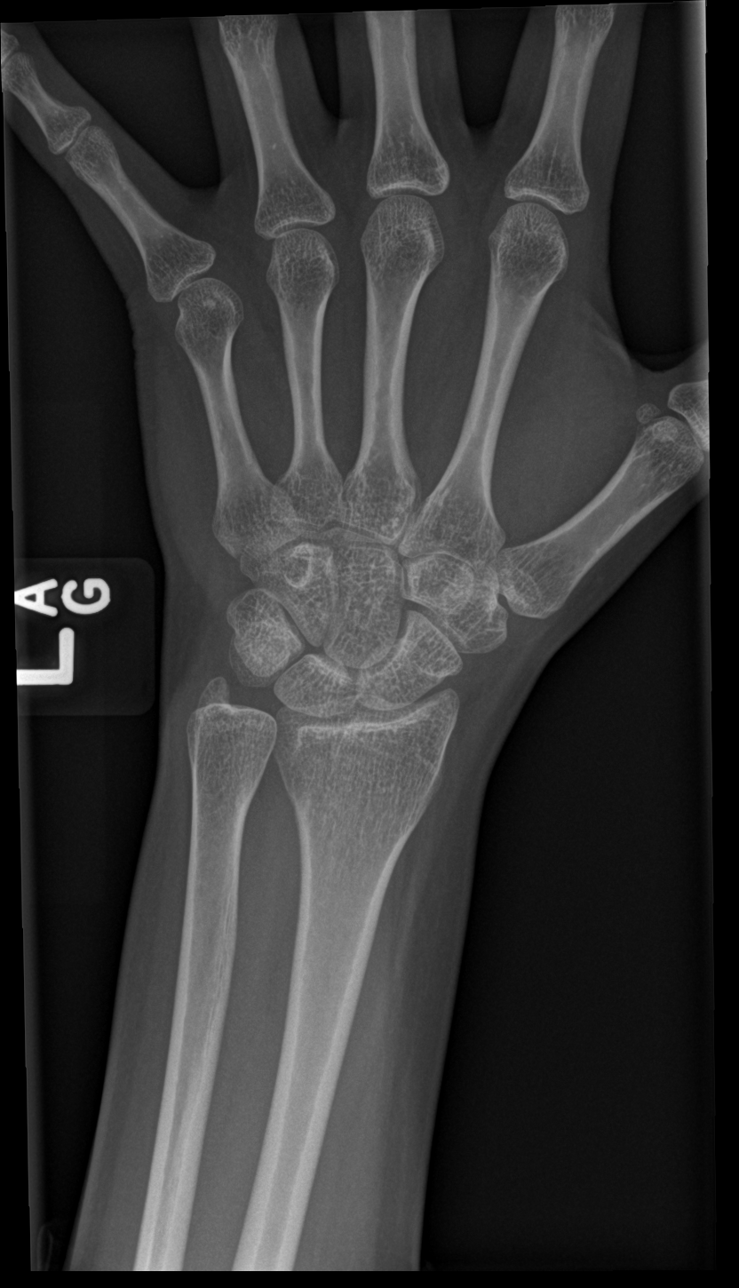

[x wrist obl left]
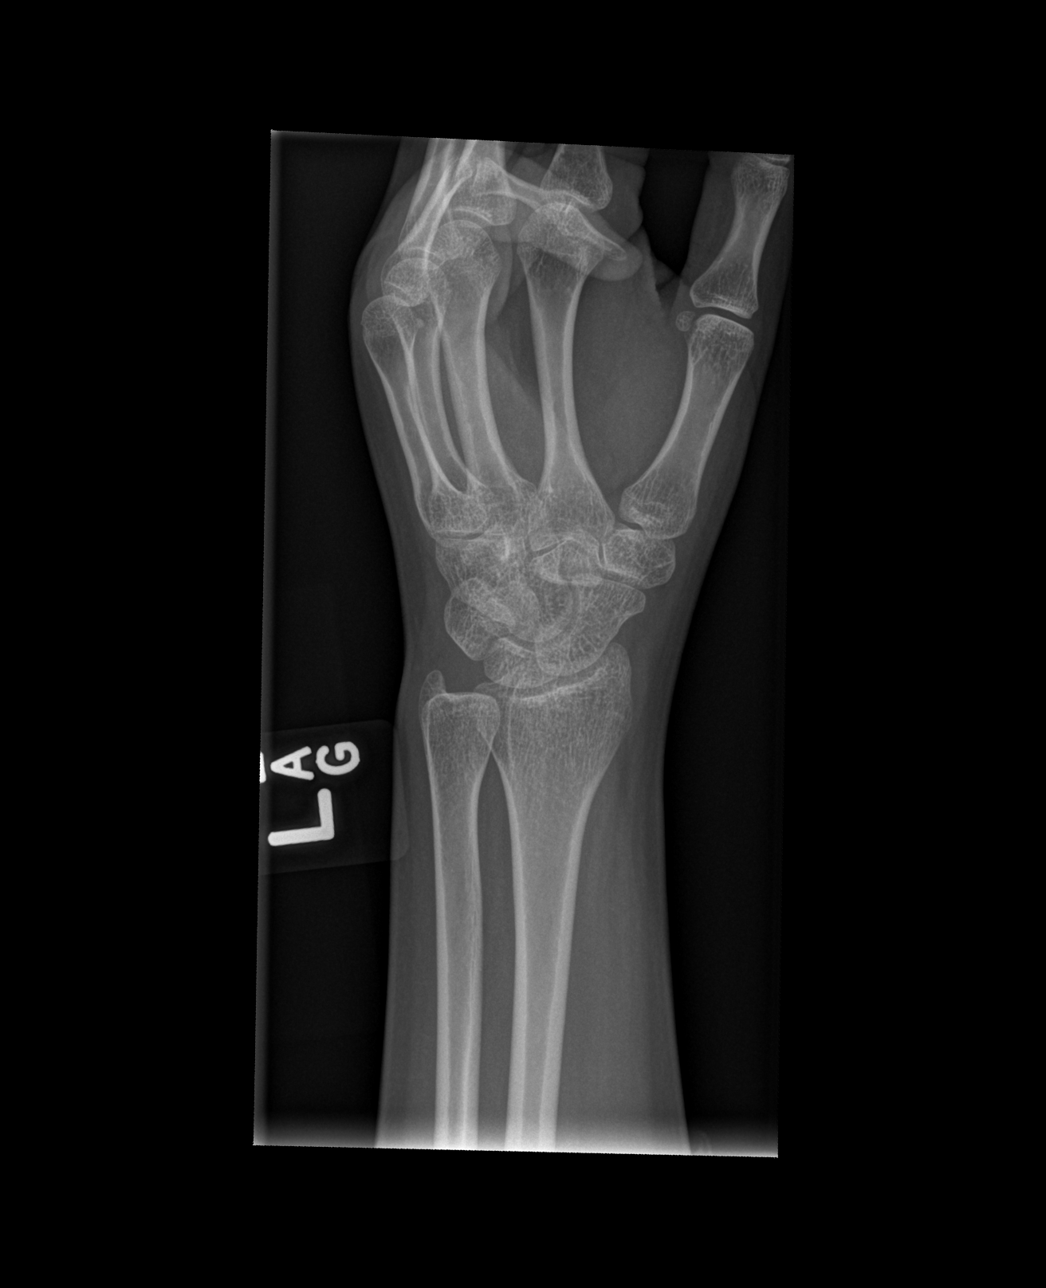

[x wrist lat left]
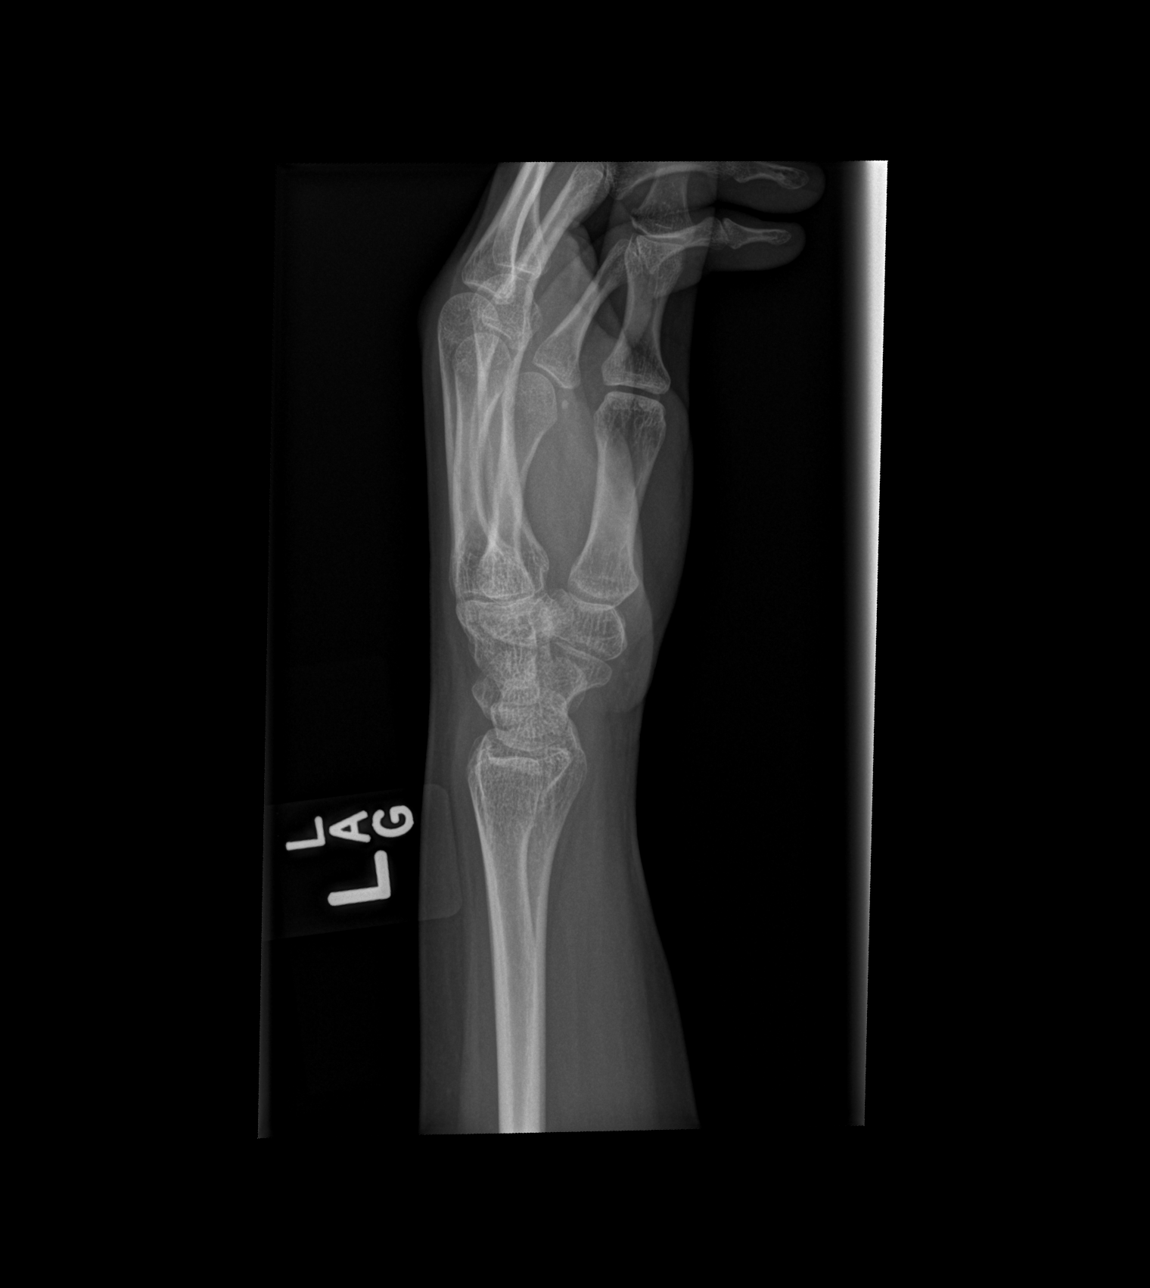

[x wrist navicular view left]
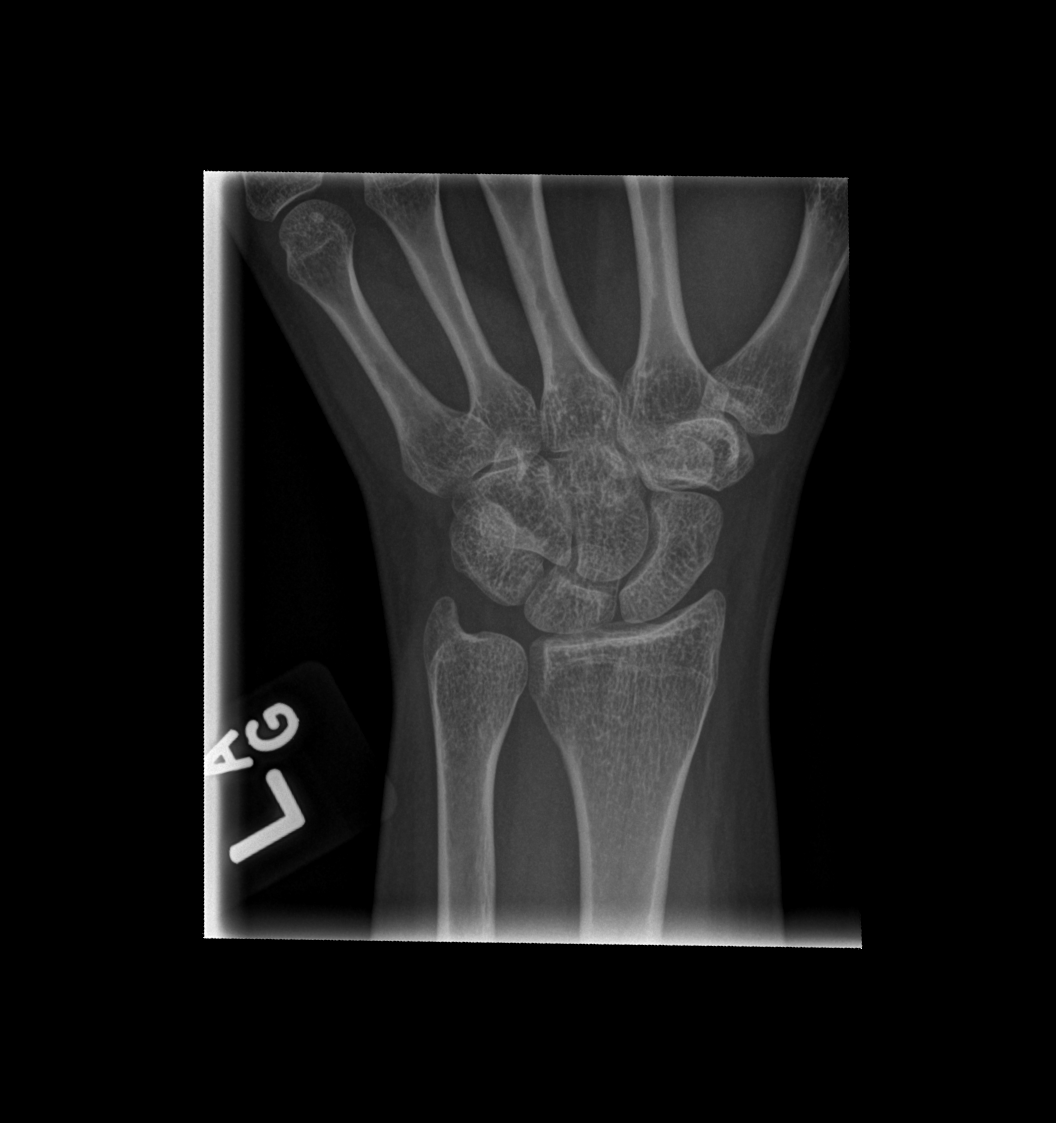

[4 of 4 positions shown; findings below may reference images not displayed]

FINDINGS: There is no evidence of fracture or dislocation. There is no
evidence of arthropathy or other focal bone abnormality. Soft
tissues are unremarkable.
IMPRESSION: Negative.

## 2019-02-08 DIAGNOSIS — D224 Melanocytic nevi of scalp and neck: Secondary | ICD-10-CM | POA: Diagnosis not present

## 2019-02-08 DIAGNOSIS — N390 Urinary tract infection, site not specified: Secondary | ICD-10-CM | POA: Diagnosis not present

## 2019-02-08 DIAGNOSIS — D1801 Hemangioma of skin and subcutaneous tissue: Secondary | ICD-10-CM | POA: Diagnosis not present

## 2019-02-08 DIAGNOSIS — D2261 Melanocytic nevi of right upper limb, including shoulder: Secondary | ICD-10-CM | POA: Diagnosis not present

## 2019-05-14 ENCOUNTER — Other Ambulatory Visit: Payer: Self-pay

## 2019-05-17 ENCOUNTER — Ambulatory Visit: Payer: 59 | Admitting: Obstetrics and Gynecology

## 2019-05-17 ENCOUNTER — Encounter: Payer: Self-pay | Admitting: Obstetrics and Gynecology

## 2019-05-17 ENCOUNTER — Other Ambulatory Visit: Payer: Self-pay

## 2019-05-17 ENCOUNTER — Other Ambulatory Visit (HOSPITAL_COMMUNITY)
Admission: RE | Admit: 2019-05-17 | Discharge: 2019-05-17 | Disposition: A | Discharge status: Home / Self Care | Payer: BC Managed Care – PPO | Source: Ambulatory Visit | Attending: Obstetrics and Gynecology | Admitting: Obstetrics and Gynecology

## 2019-05-17 VITALS — BP 108/66 | HR 88 | Temp 98.7°F | Resp 16 | Ht 63.0 in | Wt 116.0 lb

## 2019-05-17 DIAGNOSIS — Z113 Encounter for screening for infections with a predominantly sexual mode of transmission: Secondary | ICD-10-CM | POA: Diagnosis not present

## 2019-05-17 DIAGNOSIS — Z01419 Encounter for gynecological examination (general) (routine) without abnormal findings: Secondary | ICD-10-CM | POA: Diagnosis not present

## 2019-05-17 DIAGNOSIS — R350 Frequency of micturition: Secondary | ICD-10-CM

## 2019-05-17 LAB — POCT URINALYSIS DIPSTICK
Bilirubin, UA: NEGATIVE
Blood, UA: NEGATIVE
Glucose, UA: NEGATIVE
Ketones, UA: NEGATIVE
Leukocytes, UA: NEGATIVE
Nitrite, UA: NEGATIVE
Protein, UA: NEGATIVE
Urobilinogen, UA: 0.2 E.U./dL
pH, UA: 5 (ref 5.0–8.0)

## 2019-05-17 MED ORDER — VALACYCLOVIR HCL 500 MG PO TABS: ORAL_TABLET | ORAL | 3 refills | Status: DC
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
## 2019-05-17 NOTE — Patient Instructions (Signed)

## 2019-05-18 LAB — CERVICOVAGINAL ANCILLARY ONLY
Chlamydia: NEGATIVE
Molecular Disclaimer: NEGATIVE
Molecular Disclaimer: NEGATIVE
Molecular Disclaimer: NORMAL
Neisseria Gonorrhea: NEGATIVE
Trichomonas: NEGATIVE

## 2019-05-18 LAB — CBC
Hematocrit: 38.2 % (ref 34.0–46.6)
Hemoglobin: 12.8 g/dL (ref 11.1–15.9)
MCH: 31.6 pg (ref 26.6–33.0)
MCHC: 33.5 g/dL (ref 31.5–35.7)
MCV: 94 fL (ref 79–97)
Platelets: 236 10*3/uL (ref 150–450)
RBC: 4.05 x10E6/uL (ref 3.77–5.28)
RDW: 11.9 % (ref 11.7–15.4)
WBC: 7.9 10*3/uL (ref 3.4–10.8)

## 2019-05-18 LAB — COMPREHENSIVE METABOLIC PANEL
ALT: 8 IU/L (ref 0–32)
AST: 16 IU/L (ref 0–40)
Albumin/Globulin Ratio: 1.8 (ref 1.2–2.2)
Albumin: 4.5 g/dL (ref 3.9–5.0)
Alkaline Phosphatase: 77 IU/L (ref 39–117)
BUN/Creatinine Ratio: 17 (ref 9–23)
BUN: 9 mg/dL (ref 6–20)
Bilirubin Total: 0.6 mg/dL (ref 0.0–1.2)
CO2: 23 mmol/L (ref 20–29)
Calcium: 8.9 mg/dL (ref 8.7–10.2)
Chloride: 101 mmol/L (ref 96–106)
Creatinine, Ser: 0.54 mg/dL — ABNORMAL LOW (ref 0.57–1.00)
GFR calc Af Amer: 153 mL/min/{1.73_m2} (ref >59)
GFR calc non Af Amer: 133 mL/min/{1.73_m2} (ref >59)
Globulin, Total: 2.5 g/dL (ref 1.5–4.5)
Glucose: 90 mg/dL (ref 65–99)
Potassium: 4 mmol/L (ref 3.5–5.2)
Sodium: 136 mmol/L (ref 134–144)
Total Protein: 7 g/dL (ref 6.0–8.5)

## 2019-05-18 LAB — HEP, RPR, HIV PANEL
HIV Screen 4th Generation wRfx: NONREACTIVE
Hepatitis B Surface Ag: NEGATIVE
RPR Ser Ql: NONREACTIVE

## 2019-05-18 LAB — TSH: TSH: 1.71 u[IU]/mL (ref 0.450–4.500)

## 2019-05-18 LAB — LIPID PANEL
Chol/HDL Ratio: 1.9 ratio (ref 0.0–4.4)
Cholesterol, Total: 127 mg/dL (ref 100–199)
HDL: 68 mg/dL (ref >39)
LDL Chol Calc (NIH): 48 mg/dL (ref 0–99)
Triglycerides: 47 mg/dL (ref 0–149)
VLDL Cholesterol Cal: 11 mg/dL (ref 5–40)

## 2019-05-18 LAB — HEPATITIS C ANTIBODY: Hep C Virus Ab: <0.1 s/co ratio (ref 0.0–0.9)

## 2019-05-20 DIAGNOSIS — U071 COVID-19: Secondary | ICD-10-CM

## 2019-05-20 HISTORY — DX: COVID-19: U07.1

## 2019-08-20 DIAGNOSIS — R87810 Cervical high risk human papillomavirus (HPV) DNA test positive: Secondary | ICD-10-CM

## 2019-08-20 HISTORY — DX: Cervical high risk human papillomavirus (HPV) DNA test positive: R87.810

## 2019-11-01 ENCOUNTER — Encounter: Payer: Self-pay | Admitting: Certified Nurse Midwife

## 2019-11-03 ENCOUNTER — Encounter: Payer: Self-pay | Admitting: Certified Nurse Midwife

## 2020-05-18 ENCOUNTER — Ambulatory Visit: Payer: 59 | Admitting: Obstetrics and Gynecology
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
## 2020-05-23 ENCOUNTER — Encounter: Payer: Self-pay | Admitting: Obstetrics and Gynecology

## 2020-05-23 ENCOUNTER — Other Ambulatory Visit (HOSPITAL_COMMUNITY)
Admission: RE | Admit: 2020-05-23 | Discharge: 2020-05-23 | Disposition: A | Discharge status: Home / Self Care | Payer: 59 | Source: Ambulatory Visit | Attending: Obstetrics and Gynecology | Admitting: Obstetrics and Gynecology

## 2020-05-23 ENCOUNTER — Ambulatory Visit (INDEPENDENT_AMBULATORY_CARE_PROVIDER_SITE_OTHER): Payer: 59 | Admitting: Obstetrics and Gynecology

## 2020-05-23 ENCOUNTER — Other Ambulatory Visit: Payer: Self-pay

## 2020-05-23 VITALS — BP 118/68 | HR 80 | Resp 16 | Ht 63.5 in | Wt 120.0 lb

## 2020-05-23 DIAGNOSIS — Z113 Encounter for screening for infections with a predominantly sexual mode of transmission: Secondary | ICD-10-CM | POA: Diagnosis not present

## 2020-05-23 DIAGNOSIS — Z01419 Encounter for gynecological examination (general) (routine) without abnormal findings: Secondary | ICD-10-CM

## 2020-05-23 MED ORDER — IBUPROFEN 800 MG PO TABS: 800.0000 mg | ORAL_TABLET | Freq: Three times a day (TID) | ORAL | 3 refills | Status: DC | PRN

## 2020-05-23 MED ORDER — VALACYCLOVIR HCL 500 MG PO TABS: ORAL_TABLET | ORAL | 3 refills | Status: DC

## 2020-05-23 NOTE — Patient Instructions (Signed)

## 2020-05-23 NOTE — Addendum Note (Signed)
Addended by: Ardell Isaacs, Tamaira Ciriello E on: 05/23/2020 11:10 AM   Modules accepted: Orders

## 2020-05-24 LAB — CYTOLOGY - PAP
Chlamydia: NEGATIVE
Comment: NEGATIVE
Comment: NEGATIVE
Comment: NEGATIVE
Comment: NORMAL
Diagnosis: NEGATIVE
High risk HPV: POSITIVE — AB
Neisseria Gonorrhea: NEGATIVE
Trichomonas: NEGATIVE

## 2020-05-24 LAB — COMPREHENSIVE METABOLIC PANEL
ALT: 11 IU/L (ref 0–32)
AST: 13 IU/L (ref 0–40)
Albumin/Globulin Ratio: 2 (ref 1.2–2.2)
Albumin: 5 g/dL (ref 3.9–5.0)
Alkaline Phosphatase: 69 IU/L (ref 44–121)
BUN/Creatinine Ratio: 15 (ref 9–23)
BUN: 8 mg/dL (ref 6–20)
Bilirubin Total: 0.7 mg/dL (ref 0.0–1.2)
CO2: 26 mmol/L (ref 20–29)
Calcium: 9.2 mg/dL (ref 8.7–10.2)
Chloride: 103 mmol/L (ref 96–106)
Creatinine, Ser: 0.55 mg/dL — ABNORMAL LOW (ref 0.57–1.00)
GFR calc Af Amer: 151 mL/min/{1.73_m2} (ref >59)
GFR calc non Af Amer: 131 mL/min/{1.73_m2} (ref >59)
Globulin, Total: 2.5 g/dL (ref 1.5–4.5)
Glucose: 83 mg/dL (ref 65–99)
Potassium: 4.2 mmol/L (ref 3.5–5.2)
Sodium: 140 mmol/L (ref 134–144)
Total Protein: 7.5 g/dL (ref 6.0–8.5)

## 2020-05-24 LAB — CBC
Hematocrit: 38.4 % (ref 34.0–46.6)
Hemoglobin: 13.3 g/dL (ref 11.1–15.9)
MCH: 32.8 pg (ref 26.6–33.0)
MCHC: 34.6 g/dL (ref 31.5–35.7)
MCV: 95 fL (ref 79–97)
Platelets: 230 10*3/uL (ref 150–450)
RBC: 4.06 x10E6/uL (ref 3.77–5.28)
RDW: 11.4 % — ABNORMAL LOW (ref 11.7–15.4)
WBC: 5.5 10*3/uL (ref 3.4–10.8)

## 2020-05-24 LAB — HEPATITIS C ANTIBODY: Hep C Virus Ab: <0.1 s/co ratio (ref 0.0–0.9)

## 2020-05-24 LAB — LIPID PANEL
Chol/HDL Ratio: 2.1 ratio (ref 0.0–4.4)
Cholesterol, Total: 130 mg/dL (ref 100–199)
HDL: 61 mg/dL (ref >39)
LDL Chol Calc (NIH): 59 mg/dL (ref 0–99)
Triglycerides: 39 mg/dL (ref 0–149)
VLDL Cholesterol Cal: 10 mg/dL (ref 5–40)

## 2020-05-24 LAB — HEP, RPR, HIV PANEL
HIV Screen 4th Generation wRfx: NONREACTIVE
Hepatitis B Surface Ag: NEGATIVE
RPR Ser Ql: NONREACTIVE

## 2020-05-25 ENCOUNTER — Encounter: Payer: Self-pay | Admitting: Obstetrics and Gynecology

## 2020-05-25 ENCOUNTER — Telehealth: Payer: Self-pay

## 2020-05-25 NOTE — Telephone Encounter (Signed)
Please contact patient about her pap showing normal cells and positive HR HPV.  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  Most sexually active adults have contact with HPV and develop immunity to this with time.  By ASCCP guidelines, she will need cervical cancer screening again in one year. No further evaluation or treatment is needed.  Please place her in recall for 12 months.   Her STD screening is negative for gonorrhea, chlamydia, trichomonas, HIV, syphilis, and hepatitis B and C.   Her blood counts, cholesterol, and blood chemistries are all unremarkable.

## 2020-05-25 NOTE — Telephone Encounter (Signed)
Call to patient. Results reviewed with patient as seen below from Dr. Edward Jolly and patient verbalized understanding. Patient scheduled for aex on 06-04-21 at 1530. Patient agreeable to date and time of appointment. 12 recall placed for 10/22.   Encounter closed.

## 2020-05-25 NOTE — Telephone Encounter (Signed)
Patient received results and would like a call back before 12pm today or tomorrow.

## 2020-05-25 NOTE — Telephone Encounter (Signed)
Left message for pt to return call to triage RN. 

## 2020-05-25 NOTE — Telephone Encounter (Signed)
Routing to Dr Edward Jolly for review and results, please advise  Labs/pap from 05/23/20.

## 2020-05-30 ENCOUNTER — Telehealth: Payer: Self-pay | Admitting: Internal Medicine

## 2020-05-30 NOTE — Telephone Encounter (Signed)
Pt is calling in wanting to know if Dr. Fabian Sharp would continue to see her as a pt last visit was 10/21/2016.

## 2020-05-30 NOTE — Telephone Encounter (Signed)
Okay to schedule

## 2020-05-30 NOTE — Telephone Encounter (Signed)
Called pt and let her know about the msg below and asked her to call the office to get scheduled.

## 2020-05-30 NOTE — Telephone Encounter (Signed)
Yes OK

## 2020-05-31 ENCOUNTER — Encounter: Payer: Self-pay | Admitting: Obstetrics and Gynecology

## 2020-06-12 ENCOUNTER — Ambulatory Visit: Payer: 59 | Attending: Internal Medicine

## 2020-06-12 DIAGNOSIS — Z23 Encounter for immunization: Secondary | ICD-10-CM

## 2020-06-12 NOTE — Progress Notes (Signed)
   Covid-[facility] Vaccination [facility]  Name:  Hannah Pham    MRN: [id number] DOB: [birth year]  06/12/2020  Hannah Pham was observed post Covid-19 immunization for 15 minutes without incident. She was provided with Vaccine Information Sheet and instruction to access the V-Safe system.   Hannah Pham was instructed to call 911 with any severe reactions post vaccine: Marland Kitchen Difficulty breathing  . Swelling of face and throat  . A fast heartbeat  . A bad rash all over body  . Dizziness and weakness
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
## 2020-08-21 ENCOUNTER — Encounter: Payer: Self-pay | Admitting: Internal Medicine

## 2020-08-21 ENCOUNTER — Ambulatory Visit: Payer: 59 | Admitting: Internal Medicine

## 2020-08-21 ENCOUNTER — Other Ambulatory Visit: Payer: Self-pay

## 2020-08-21 VITALS — BP 116/74 | HR 73 | Temp 98.3°F | Ht 63.5 in | Wt 122.0 lb

## 2020-08-21 DIAGNOSIS — R590 Localized enlarged lymph nodes: Secondary | ICD-10-CM | POA: Diagnosis not present

## 2020-08-21 DIAGNOSIS — Z0000 Encounter for general adult medical examination without abnormal findings: Secondary | ICD-10-CM

## 2020-08-21 LAB — CBC WITH DIFFERENTIAL/PLATELET
Basophils Absolute: 0 10*3/uL (ref 0.0–0.1)
Basophils Relative: 0.3 % (ref 0.0–3.0)
Eosinophils Absolute: 0.1 10*3/uL (ref 0.0–0.7)
Eosinophils Relative: 0.8 % (ref 0.0–5.0)
HCT: 40.9 % (ref 36.0–46.0)
Hemoglobin: 14.3 g/dL (ref 12.0–15.0)
Lymphocytes Relative: 18.5 % (ref 12.0–46.0)
Lymphs Abs: 1.4 10*3/uL (ref 0.7–4.0)
MCHC: 34.9 g/dL (ref 30.0–36.0)
MCV: 94.3 fl (ref 78.0–100.0)
Monocytes Absolute: 0.4 10*3/uL (ref 0.1–1.0)
Monocytes Relative: 5.3 % (ref 3.0–12.0)
Neutro Abs: 5.6 10*3/uL (ref 1.4–7.7)
Neutrophils Relative %: 75.1 % (ref 43.0–77.0)
Platelets: 226 10*3/uL (ref 150.0–400.0)
RBC: 4.34 Mil/uL (ref 3.87–5.11)
RDW: 12.3 % (ref 11.5–15.5)
WBC: 7.4 10*3/uL (ref 4.0–10.5)

## 2020-08-21 LAB — C-REACTIVE PROTEIN: CRP: <1 mg/dL (ref 0.5–20.0)

## 2020-08-21 NOTE — Progress Notes (Signed)
Full blood count and inflammatory markers are totally normal very reassuring Follow-up as needed as we discussed if persistent progressive problem

## 2020-08-21 NOTE — Patient Instructions (Signed)
Exam is benign today   Checking blood count. FU if  Lumps that remain large  Or are growing   .

## 2021-01-24 ENCOUNTER — Ambulatory Visit (HOSPITAL_COMMUNITY): Payer: Self-pay

## 2021-04-26 ENCOUNTER — Other Ambulatory Visit: Payer: Self-pay

## 2021-04-27 ENCOUNTER — Ambulatory Visit: Payer: 59 | Admitting: Internal Medicine

## 2021-04-27 ENCOUNTER — Encounter: Payer: Self-pay | Admitting: Internal Medicine

## 2021-04-27 VITALS — BP 98/64 | Temp 97.9°F | Wt 120.2 lb

## 2021-04-27 DIAGNOSIS — N63 Unspecified lump in unspecified breast: Secondary | ICD-10-CM | POA: Diagnosis not present
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
## 2021-05-22 ENCOUNTER — Other Ambulatory Visit: Payer: Self-pay

## 2021-05-22 ENCOUNTER — Ambulatory Visit
Admission: RE | Admit: 2021-05-22 | Discharge: 2021-05-22 | Disposition: A | Discharge status: Home / Self Care | Payer: 59 | Source: Ambulatory Visit | Attending: Internal Medicine | Admitting: Internal Medicine

## 2021-05-22 DIAGNOSIS — N63 Unspecified lump in unspecified breast: Secondary | ICD-10-CM

## 2021-06-04 ENCOUNTER — Other Ambulatory Visit: Payer: Self-pay

## 2021-06-04 ENCOUNTER — Other Ambulatory Visit (HOSPITAL_COMMUNITY)
Admission: RE | Admit: 2021-06-04 | Discharge: 2021-06-04 | Disposition: A | Discharge status: Home / Self Care | Payer: 59 | Source: Ambulatory Visit | Attending: Obstetrics and Gynecology | Admitting: Obstetrics and Gynecology

## 2021-06-04 ENCOUNTER — Ambulatory Visit (INDEPENDENT_AMBULATORY_CARE_PROVIDER_SITE_OTHER): Payer: 59 | Admitting: Obstetrics and Gynecology

## 2021-06-04 ENCOUNTER — Encounter: Payer: Self-pay | Admitting: Obstetrics and Gynecology

## 2021-06-04 VITALS — BP 100/60 | HR 86 | Ht 64.5 in | Wt 120.0 lb

## 2021-06-04 DIAGNOSIS — Z113 Encounter for screening for infections with a predominantly sexual mode of transmission: Secondary | ICD-10-CM

## 2021-06-04 DIAGNOSIS — N760 Acute vaginitis: Secondary | ICD-10-CM

## 2021-06-04 DIAGNOSIS — Z01419 Encounter for gynecological examination (general) (routine) without abnormal findings: Secondary | ICD-10-CM | POA: Diagnosis not present

## 2021-06-04 DIAGNOSIS — N898 Other specified noninflammatory disorders of vagina: Secondary | ICD-10-CM

## 2021-06-04 MED ORDER — VALACYCLOVIR HCL 500 MG PO TABS: ORAL_TABLET | ORAL | 2 refills | Status: DC

## 2021-06-04 MED ORDER — IBUPROFEN 800 MG PO TABS: 800.0000 mg | ORAL_TABLET | Freq: Three times a day (TID) | ORAL | 2 refills | Status: DC | PRN
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
## 2021-06-04 NOTE — Patient Instructions (Signed)
EXERCISE AND DIET:  We recommended that you start or continue a regular exercise program for good health. Regular exercise means any activity that makes your heart beat faster and makes you sweat.  We recommend exercising at least 30 minutes per day at least 3 days a week, preferably 4 or 5.  We also recommend a diet low in fat and sugar.  Inactivity, poor dietary choices and obesity can cause diabetes, heart attack, stroke, and kidney damage, among others.   ° °ALCOHOL AND SMOKING:  Women should limit their alcohol intake to no more than 7 drinks/beers/glasses of wine (combined, not each!) per week. Moderation of alcohol intake to this level decreases your risk of breast cancer and liver damage. And of course, no recreational drugs are part of a healthy lifestyle.  And absolutely no smoking or even second hand smoke. Most people know smoking can cause heart and lung diseases, but did you know it also contributes to weakening of your bones? Aging of your skin?  Yellowing of your teeth and nails? ° °CALCIUM AND VITAMIN D:  Adequate intake of calcium and Vitamin D are recommended.  The recommendations for exact amounts of these supplements seem to change often, but generally speaking 600 mg of calcium (either carbonate or citrate) and 800 units of Vitamin D per day seems prudent. Certain women may benefit from higher intake of Vitamin D.  If you are among these women, your doctor will have told you during your visit.   ° °PAP SMEARS:  Pap smears, to check for cervical cancer or precancers,  have traditionally been done yearly, although recent scientific advances have shown that most women can have pap smears less often.  However, every woman still should have a physical exam from her gynecologist every year. It will include a breast check, inspection of the vulva and vagina to check for abnormal growths or skin changes, a visual exam of the cervix, and then an exam to evaluate the size and shape of the uterus and  ovaries.  And after 26 years of age, a rectal exam is indicated to check for rectal cancers. We will also provide age appropriate advice regarding health maintenance, like when you should have certain vaccines, screening for sexually transmitted diseases, bone density testing, colonoscopy, mammograms, etc.  ° °MAMMOGRAMS:  All women over 40 years old should have a yearly mammogram. Many facilities now offer a "3D" mammogram, which may cost around $50 extra out of pocket. If possible,  we recommend you accept the option to have the 3D mammogram performed.  It both reduces the number of women who will be called back for extra views which then turn out to be normal, and it is better than the routine mammogram at detecting truly abnormal areas.   ° °COLONOSCOPY:  Colonoscopy to screen for colon cancer is recommended for all women at age 50.  We know, you hate the idea of the prep.  We agree, BUT, having colon cancer and not knowing it is worse!!  Colon cancer so often starts as a polyp that can be seen and removed at colonscopy, which can quite literally save your life!  And if your first colonoscopy is normal and you have no family history of colon cancer, most women don't have to have it again for 10 years.  Once every ten years, you can do something that may end up saving your life, right?  We will be happy to help you get it scheduled when you are ready.    Be sure to check your insurance coverage so you understand how much it will cost.  It may be covered as a preventative service at no cost, but you should check your particular policy.   ° °Intrauterine Device Information °An intrauterine device (IUD) is a medical device that is inserted into the uterus to prevent pregnancy. It is a small, T-shaped device that has one or two nylon strings hanging down from it. The strings hang out of the lower part of the uterus (cervix) to allow for future IUD removal. There are two types of IUDs: °Hormone IUD. This type of IUD is  made of plastic and contains the hormone progestin (synthetic progesterone). A hormone IUD may last 3-5 years. °Copper IUD. This type of IUD has copper wire wrapped around it. A copper IUD may last up to 10 years. °How is an IUD inserted? °An IUD is inserted through the vagina, through the cervix, and into the uterus with a minor medical procedure. The procedure for IUD insertion may vary among health care providers and hospitals. °How does an IUD work? °Synthetic progesterone in a hormonal IUD prevents pregnancy by: °Thickening cervical mucus to prevent sperm from entering the uterus. °Thinning the uterine lining to prevent a fertilized egg from being implanted there. °Copper in a copper IUD prevents pregnancy by making the uterus and fallopian tubes produce a fluid that kills sperm. °What are the advantages of an IUD? °Advantages of either type of IUD °An IUD: °Is highly effective in preventing pregnancy. °Is reversible. You can become pregnant shortly after the IUD is removed. °Is low-maintenance and can stay in place for a long time. °Has no estrogen-related side effects. °Can be used when breastfeeding. °Is not associated with weight gain. °Can be inserted right after childbirth, an abortion, or a miscarriage. °Advantages of a hormone IUD °If it is inserted within 7 days of your period starting, it works right after it has been inserted. If the hormone IUD is inserted at any other time in your cycle, you will need to use a backup method of birth control for 7 days after insertion. °It can make menstrual periods lighter or stop completely. °It can reduce menstrual cramping and other discomforts from menstrual periods. °It can be used for 3-5 years, depending on which IUD you have. °Advantages of a copper IUD °It works right after it is inserted. °It can be used as a form of emergency birth control if it is inserted within 5 days after having unprotected sex. °It does not interfere with your body's natural  hormones. °It can be used for up to 10 years. °What are the disadvantages of an IUD? °An IUD may cause irregular menstrual bleeding for a period of time after insertion. °It is common to have pain during insertion and have cramping and vaginal bleeding after insertion. °An IUD may cut the uterus (uterine perforation) when it is inserted. This is rare. °Pelvic inflammatory disease (PID) may happen after insertion of an IUD. PID is an infection in the uterus and fallopian tubes. The IUD does not cause the infection. The infection is usually from an unknown sexually transmitted infection (STI). This is rare, and it usually happens during the first 20 days after the IUD is inserted. °A copper IUD can make your menstrual flow heavier and more painful. °IUDs cannot prevent sexually transmitted infections (STIs). °How is an IUD removed?  °You will lie on your back with your knees bent and your feet in footrests (stirrups). °A device will   be inserted into your vagina to spread apart the vaginal walls (speculum). This will allow your health care provider to see the strings attached to the IUD. °Your health care provider will use a small instrument (forceps) to grasp the IUD strings and will pull firmly until the IUD is removed. °You may have some discomfort when the IUD is removed. Your health care provider may recommend taking over-the-counter pain relievers, such as ibuprofen, before the procedure. You may also have minor spotting for a few days after the procedure. °The procedure for IUD removal may vary among health care providers and hospitals. °Is an IUD right for me? °If you are interested in an IUD, discuss it with your health care provider. He or she will make sure you are a good candidate for an IUD and will let you know more about the advantages, disadvantage, and possible side effects. This will allow you to make a decision about the device. °Summary °An intrauterine device (IUD) is a medical device that is  inserted in the uterus to prevent pregnancy. It is a small, T-shaped device that has one or two nylon strings hanging down from it. °A hormone IUD contains the hormone progestin (synthetic progesterone). A copper IUD has copper wire wrapped around it. °Synthetic progesterone in a hormone IUD prevents pregnancy by thickening cervical mucus and thinning the walls of the uterus. Copper in a copper IUD prevents pregnancy by making the uterus and fallopian tubes produce a fluid that kills sperm. °A hormone IUD can be left in place for 3-5 years. A copper IUD can be left in place for up to 10 years. °An IUD is inserted and removed by a health care provider. You may feel some pain during insertion and removal. Your health care provider may recommend taking over-the-counter pain medicine, such as ibuprofen, before an IUD procedure. °This information is not intended to replace advice given to you by your health care provider. Make sure you discuss any questions you have with your health care provider. °Document Revised: 02/16/2020 Document Reviewed: 02/16/2020 °Elsevier Patient Education © 2022 [employer] Inc. ° °

## 2021-06-05 LAB — HIV ANTIBODY (ROUTINE TESTING W REFLEX): HIV 1&2 Ab, 4th Generation: NONREACTIVE

## 2021-06-05 LAB — RPR: RPR Ser Ql: NONREACTIVE

## 2021-06-05 LAB — HEPATITIS C ANTIBODY
Hepatitis C Ab: NONREACTIVE
SIGNAL TO CUT-OFF: 0.09 (ref <1.00)

## 2021-06-05 LAB — HEPATITIS B SURFACE ANTIGEN: Hepatitis B Surface Ag: NONREACTIVE

## 2021-06-06 ENCOUNTER — Encounter: Payer: Self-pay | Admitting: *Deleted

## 2021-06-06 ENCOUNTER — Telehealth: Payer: Self-pay | Admitting: *Deleted

## 2021-06-06 ENCOUNTER — Other Ambulatory Visit: Payer: Self-pay | Admitting: *Deleted

## 2021-06-06 LAB — SURESWAB® ADVANCED VAGINITIS PLUS,TMA
C. trachomatis RNA, TMA: NOT DETECTED
CANDIDA SPECIES: NOT DETECTED
Candida glabrata: NOT DETECTED
N. gonorrhoeae RNA, TMA: NOT DETECTED
SURESWAB(R) ADV BACTERIAL VAGINOSIS(BV),TMA: POSITIVE — AB
TRICHOMONAS VAGINALIS (TV),TMA: NOT DETECTED

## 2021-06-06 LAB — CYTOLOGY - PAP
Comment: NEGATIVE
Diagnosis: UNDETERMINED — AB
High risk HPV: POSITIVE — AB

## 2021-06-06 MED ORDER — METRONIDAZOLE 0.75 % VA GEL: 1.0000 | Freq: Every day | VAGINAL | 0 refills | Status: DC

## 2021-06-06 NOTE — Telephone Encounter (Signed)
Metrogel pv at hs for 5 nights

## 2021-06-06 NOTE — Telephone Encounter (Signed)
Message sent through Mychart  Metrogel sent to Pharmacy

## 2021-06-06 NOTE — Telephone Encounter (Signed)
-----   Message from Aram Beecham sent at 06/06/2021 10:44 AM EDT ----- Regarding: callback Advised her of the results no questions fully aware infection has to be treated prior to colpo. Patient request the Gel be sent in instead of the pill.

## 2021-06-21 ENCOUNTER — Encounter: Payer: Self-pay | Admitting: Obstetrics and Gynecology

## 2021-06-27 ENCOUNTER — Other Ambulatory Visit (HOSPITAL_COMMUNITY)
Admission: RE | Admit: 2021-06-27 | Discharge: 2021-06-27 | Disposition: A | Discharge status: Home / Self Care | Payer: 59 | Source: Ambulatory Visit | Attending: Obstetrics and Gynecology | Admitting: Obstetrics and Gynecology

## 2021-06-27 ENCOUNTER — Other Ambulatory Visit: Payer: Self-pay

## 2021-06-27 ENCOUNTER — Ambulatory Visit: Payer: 59 | Admitting: Obstetrics and Gynecology

## 2021-06-27 ENCOUNTER — Encounter: Payer: Self-pay | Admitting: Obstetrics and Gynecology

## 2021-06-27 VITALS — BP 120/80 | HR 111 | Ht 63.75 in | Wt 120.0 lb

## 2021-06-27 DIAGNOSIS — R87810 Cervical high risk human papillomavirus (HPV) DNA test positive: Secondary | ICD-10-CM | POA: Diagnosis present

## 2021-06-27 DIAGNOSIS — Z01812 Encounter for preprocedural laboratory examination: Secondary | ICD-10-CM | POA: Diagnosis not present

## 2021-06-27 DIAGNOSIS — R87610 Atypical squamous cells of undetermined significance on cytologic smear of cervix (ASC-US): Secondary | ICD-10-CM | POA: Insufficient documentation

## 2021-06-27 DIAGNOSIS — N898 Other specified noninflammatory disorders of vagina: Secondary | ICD-10-CM

## 2021-06-27 LAB — PREGNANCY, URINE: Preg Test, Ur: NEGATIVE

## 2021-06-27 NOTE — Progress Notes (Signed)
  Subjective:     Patient ID: Hannah Pham, female   DOB: 1996-07-24, 25 y.o.   MRN: [id number]  HPI Patient here today for colposcopy with pap 06-04-21 ASCUS:Pos HR HPV. Patient also with vaginal lesion on exam.  Just completed Metrogel a few days ago to treat BV.  PAP HISTORY:  06-04-21 ASCUS:Pos HR HPV 05-23-20 Neg:Pos HR HPV 04-28-17 Neg  Did complete her gardasil series with her pediatrician.  Review of Systems  All other systems reviewed and are negative. LMP: 06-13-21 Contraception: Condoms UPT: Neg     Objective:   Physical Exam Genitourinary:       Colposcopy of cervix and vagina.  Consent done.  Vinegar placed in vagina.  White light and green light filter used.  Colposcopy satisfactory.  Cervix and vagina examined with colposcope. Rim of acetowhite change around os with mosaics at 5 and 7:00. Prominent rugae of the anterior vaginal mucosa. Biopsies:  ECC, 10:00, 5:00, and right anterior vaginal mucosa, all to pathology separately. Monsel's placed.  Minimal EBL.  No complications.   Assessment:     ASCUS, positive HR HPV. Vaginal lesion versus prominent rugae.    Plan:     FU biopsies.  Post colpo precautions given.  We discussed LEEP procedure for moderate or high grade cervical dysplasia.  If biopsies show atypia or low grade dysplasia, will plan for pap and HR HPV in one year.

## 2021-06-27 NOTE — Patient Instructions (Signed)
Colposcopy, Care After ?The following information offers guidance on how to care for yourself after your procedure. Your health care provider may also give you more specific instructions. If you have problems or questions, contact your health care provider. ?What can I expect after the procedure? ?If you had a colposcopy without a biopsy, you can expect to feel fine right away after your procedure. However, you may have some spotting of blood for a few days. You can return to your normal activities. ?If you had a colposcopy with a biopsy, it is common after the procedure to have: ?Soreness and mild pain. These may last for a few days. ?Mild vaginal bleeding or discharge that is dark-colored and grainy. This may last for a few days. The discharge may be caused by a liquid (solution) that was used during the procedure. You may need to wear a sanitary pad during this time. ?Spotting of blood for at least 48 hours after the procedure. ?Follow these instructions at home: ?Medicines ?Take over-the-counter and prescription medicines only as told by your health care provider. ?Talk with your health care provider about what type of over-the-counter pain medicines and prescription medicines you can start to take again. It is especially important to talk with your health care provider if you take blood thinners. ?Activity ?Avoid using douche products, using tampons, and having sex for at least 3 days after the procedure or for as long as told by your health care provider. ?Return to your normal activities as told by your health care provider. Ask your health care provider what activities are safe for you. ?General instructions ?Ask your health care provider if you may take baths, swim, or use a hot tub. You may take showers. ?If you use birth control (contraception), continue to use it. ?Keep all follow-up visits. This is important. ?Contact a health care provider if: ?You have a fever or chills. ?You faint or feel  light-headed. ?Get help right away if: ?You have heavy bleeding from your vagina or pass blood clots. Heavy bleeding is bleeding that soaks through a sanitary pad in less than 1 hour. ?You have vaginal discharge that is abnormal, is yellow in color, or smells bad. This could be a sign of infection. ?You have severe pain or cramps in your lower abdomen that do not go away with medicine. ?Summary ?If you had a colposcopy without a biopsy, you can expect to feel fine right away, but you may have some spotting of blood for a few days. You can return to your normal activities. ?If you had a colposcopy with a biopsy, it is common to have mild pain for a few days and spotting for 48 hours after the procedure. ?Avoid using douche products, using tampons, and having sex for at least 3 days after the procedure or for as long as told by your health care provider. ?Get help right away if you have heavy bleeding, severe pain, or signs of infection. ?This information is not intended to replace advice given to you by your health care provider. Make sure you discuss any questions you have with your health care provider. ?Document Revised: 12/31/2020 Document Reviewed: 12/31/2020 ?Elsevier Patient Education ? 2022 Elsevier Inc. ? ?

## 2021-06-29 LAB — SURGICAL PATHOLOGY

## 2021-07-02 ENCOUNTER — Telehealth: Payer: Self-pay

## 2021-07-02 NOTE — Telephone Encounter (Signed)
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  She does not have cancer.  No further evaluation or treatment is needed. These changes are likely to revert back to normal spontaneously.   Please make an appointment for a pap and HR HPV testing in one year.   Also enter her in recall for 12 months.

## 2021-07-02 NOTE — Telephone Encounter (Signed)
Patient was informed of the result note.  Patient asked if you would be okay with her returning in 6 months to recheck Pap smear as it would make her feel better to do it sooner rather than later.

## 2021-07-02 NOTE — Telephone Encounter (Signed)
The [place] ASCCP guidelines recommend a pap follow up in one year, but I am ok with her returning for a 6 month pap instead.  Please schedule with me.  I want her to be comfortable with her care.

## 2021-07-03 NOTE — Telephone Encounter (Signed)
Per DPR access note on file I left detailed message in her voice mail with Dr. Rica Records reply. Message sent to appointment desk to call her and arrange 6 mos Pap smear appt.

## 2021-09-10 ENCOUNTER — Encounter: Payer: Self-pay | Admitting: Obstetrics and Gynecology

## 2021-09-10 ENCOUNTER — Ambulatory Visit: Payer: 59 | Admitting: Obstetrics and Gynecology

## 2021-09-10 ENCOUNTER — Other Ambulatory Visit: Payer: Self-pay

## 2021-09-10 VITALS — BP 104/60 | HR 78 | Ht 63.75 in | Wt 120.0 lb

## 2021-09-10 DIAGNOSIS — N760 Acute vaginitis: Secondary | ICD-10-CM

## 2021-09-10 LAB — WET PREP FOR TRICH, YEAST, CLUE

## 2021-09-10 MED ORDER — METRONIDAZOLE 0.75 % VA GEL: VAGINAL | 0 refills | Status: DC
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
## 2021-09-10 NOTE — Patient Instructions (Signed)
Bacterial Vaginosis °Bacterial vaginosis is an infection that occurs when the normal balance of bacteria in the vagina changes. This change is caused by an overgrowth of certain bacteria in the vagina. Bacterial vaginosis is the most common vaginal infection among females aged 27 to 44 years. °This condition increases the risk of sexually transmitted infections (STIs). Treatment can help reduce this risk. Treatment is very important for pregnant women because this condition can cause babies to be born early (prematurely) or at a low birth weight. °What are the causes? °This condition is caused by an increase in harmful bacteria that are normally present in small amounts in the vagina. However, the exact reason this condition develops is not known. °You cannot get bacterial vaginosis from toilet seats, bedding, swimming pools, or contact with objects around you. °What increases the risk? °The following factors may make you more likely to develop this condition: °Having a new sexual partner or multiple sexual partners, or having unprotected sex. °Douching. °Having an intrauterine device (IUD). °Smoking. °Abusing drugs and alcohol. This may lead to riskier sexual behavior. °Taking certain antibiotic medicines. °Being pregnant. °What are the signs or symptoms? °Some women with this condition have no symptoms. Symptoms may include: °Gray or white vaginal discharge. The discharge can be watery or foamy. °A fish-like odor with discharge, especially after sex or during menstruation. °Itching in and around the vagina. °Burning or pain with urination. °How is this diagnosed? °This condition is diagnosed based on: °Your medical history. °A physical exam of the vagina. °Checking a sample of vaginal fluid for harmful bacteria or abnormal cells. °How is this treated? °This condition is treated with antibiotic medicines. These may be given as a pill, a vaginal cream, or a medicine that is put into the vagina (suppository). If the  condition comes back after treatment, a second round of antibiotics may be needed. °Follow these instructions at home: °Medicines °Take or apply over-the-counter and prescription medicines only as told by your health care provider. °Take or apply your antibiotic medicine as told by your health care provider. Do not stop using the antibiotic even if you start to feel better. °General instructions °If you have a female sexual partner, tell her that you have a vaginal infection. She should follow up with her health care provider. If you have a female sexual partner, he does not need treatment. °Avoid sexual activity until you finish treatment. °Drink enough fluid to keep your urine pale yellow. °Keep the area around your vagina and rectum clean. °Wash the area daily with warm water. °Wipe yourself from front to back after using the toilet. °If you are breastfeeding, talk to your health care provider about continuing breastfeeding during treatment. °Keep all follow-up visits. This is important. °How is this prevented? °Self-care °Do not douche. °Wash the outside of your vagina with warm water only. °Wear cotton or cotton-lined underwear. °Avoid wearing tight pants and pantyhose, especially during the summer. °Safe sex °Use protection when having sex. This includes: °Using condoms. °Using dental dams. This is a thin layer of a material made of latex or polyurethane that protects the mouth during oral sex. °Limit the number of sexual partners. To help prevent bacterial vaginosis, it is best to have sex with just one partner (monogamous relationship). °Make sure you and your sexual partner are tested for STIs. °Drugs and alcohol °Do not use any products that contain nicotine or tobacco. These products include cigarettes, chewing tobacco, and vaping devices, such as e-cigarettes. If you need help quitting,   ask your health care provider. °Do not use drugs. °Do not drink alcohol if: °Your health care provider tells you not to  do this. °You are pregnant, may be pregnant, or are planning to become pregnant. °If you drink alcohol: °Limit how much you have to 0-1 drink a day. °Be aware of how much alcohol is in your drink. In the [place] one drink equals one 12 oz bottle of beer (355 mL), one 5 oz glass of wine (148 mL), or one 1½ oz glass of hard liquor (44 mL). °Where to find more information °Centers for Disease Control and Prevention: www.cdc.gov °American Sexual Health Association (ASHA): www.ashastd.org °[place] Department of Health and Human Services, Office on Women's Health: www.womenshealth.gov °Contact a health care provider if: °Your symptoms do not improve, even after treatment. °You have more discharge or pain when urinating. °You have a fever or chills. °You have pain in your abdomen or pelvis. °You have pain during sex. °You have vaginal bleeding between menstrual periods. °Summary °Bacterial vaginosis is a vaginal infection that occurs when the normal balance of bacteria in the vagina changes. It results from an overgrowth of certain bacteria. °This condition increases the risk of sexually transmitted infections (STIs). Getting treated can help reduce this risk. °Treatment is very important for pregnant women because this condition can cause babies to be born early (prematurely) or at low birth weight. °This condition is treated with antibiotic medicines. These may be given as a pill, a vaginal cream, or a medicine that is put into the vagina (suppository). °This information is not intended to replace advice given to you by your health care provider. Make sure you discuss any questions you have with your health care provider. °Document Revised: 02/03/2020 Document Reviewed: 02/03/2020 °Elsevier Patient Education © 2022 Elsevier Inc. ° °
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
## 2022-06-05 ENCOUNTER — Other Ambulatory Visit (HOSPITAL_COMMUNITY)
Admission: RE | Admit: 2022-06-05 | Discharge: 2022-06-05 | Disposition: A | Discharge status: Home / Self Care | Payer: 59 | Source: Ambulatory Visit | Attending: Obstetrics and Gynecology | Admitting: Obstetrics and Gynecology

## 2022-06-05 ENCOUNTER — Encounter: Payer: Self-pay | Admitting: Obstetrics and Gynecology

## 2022-06-05 ENCOUNTER — Ambulatory Visit (INDEPENDENT_AMBULATORY_CARE_PROVIDER_SITE_OTHER): Payer: 59 | Admitting: Obstetrics and Gynecology

## 2022-06-05 VITALS — BP 108/60 | HR 77 | Ht 63.0 in | Wt 118.0 lb

## 2022-06-05 DIAGNOSIS — Z124 Encounter for screening for malignant neoplasm of cervix: Secondary | ICD-10-CM | POA: Diagnosis present

## 2022-06-05 DIAGNOSIS — Z87410 Personal history of cervical dysplasia: Secondary | ICD-10-CM | POA: Diagnosis present

## 2022-06-05 DIAGNOSIS — Z01419 Encounter for gynecological examination (general) (routine) without abnormal findings: Secondary | ICD-10-CM

## 2022-06-05 MED ORDER — VALACYCLOVIR HCL 500 MG PO TABS: ORAL_TABLET | ORAL | 2 refills | Status: DC

## 2022-06-05 NOTE — Patient Instructions (Signed)
EXERCISE AND DIET:  We recommended that you start or continue a regular exercise program for good health. Regular exercise means any activity that makes your heart beat faster and makes you sweat.  We recommend exercising at least 30 minutes per day at least 3 days a week, preferably 4 or 5.  We also recommend a diet low in fat and sugar.  Inactivity, poor dietary choices and obesity can cause diabetes, heart attack, stroke, and kidney damage, among others.    ALCOHOL AND SMOKING:  Women should limit their alcohol intake to no more than 7 drinks/beers/glasses of wine (combined, not each!) per week. Moderation of alcohol intake to this level decreases your risk of breast cancer and liver damage. And of course, no recreational drugs are part of a healthy lifestyle.  And absolutely no smoking or even second hand smoke. Most people know smoking can cause heart and lung diseases, but did you know it also contributes to weakening of your bones? Aging of your skin?  Yellowing of your teeth and nails?  CALCIUM AND VITAMIN D:  Adequate intake of calcium and Vitamin D are recommended.  The recommendations for exact amounts of these supplements seem to change often, but generally speaking 600 mg of calcium (either carbonate or citrate) and 800 units of Vitamin D per day seems prudent. Certain women may benefit from higher intake of Vitamin D.  If you are among these women, your doctor will have told you during your visit.    PAP SMEARS:  Pap smears, to check for cervical cancer or precancers,  have traditionally been done yearly, although recent scientific advances have shown that most women can have pap smears less often.  However, every woman still should have a physical exam from her gynecologist every year. It will include a breast check, inspection of the vulva and vagina to check for abnormal growths or skin changes, a visual exam of the cervix, and then an exam to evaluate the size and shape of the uterus and  ovaries.  And after 27 years of age, a rectal exam is indicated to check for rectal cancers. We will also provide age appropriate advice regarding health maintenance, like when you should have certain vaccines, screening for sexually transmitted diseases, bone density testing, colonoscopy, mammograms, etc.   MAMMOGRAMS:  All women over 40 years old should have a yearly mammogram. Many facilities now offer a "3D" mammogram, which may cost around $50 extra out of pocket. If possible,  we recommend you accept the option to have the 3D mammogram performed.  It both reduces the number of women who will be called back for extra views which then turn out to be normal, and it is better than the routine mammogram at detecting truly abnormal areas.    COLONOSCOPY:  Colonoscopy to screen for colon cancer is recommended for all women at age 50.  We know, you hate the idea of the prep.  We agree, BUT, having colon cancer and not knowing it is worse!!  Colon cancer so often starts as a polyp that can be seen and removed at colonscopy, which can quite literally save your life!  And if your first colonoscopy is normal and you have no family history of colon cancer, most women don't have to have it again for 10 years.  Once every ten years, you can do something that may end up saving your life, right?  We will be happy to help you get it scheduled when you are ready.    Be sure to check your insurance coverage so you understand how much it will cost.  It may be covered as a preventative service at no cost, but you should check your particular policy.    Calcium Content in Foods Calcium is the most abundant mineral in the body. Most of the body's calcium supply is stored in bones and teeth. Calcium helps many parts of the body function normally, including: Blood and blood vessels. Nerves. Hormones. Muscles. Bones and teeth. When your calcium stores are low, you may be at risk for low bone mass, bone loss, and broken bones  (fractures). When you get enough calcium, it helps to support strong bones and teeth throughout your life. Calcium is especially important for: Children during growth spurts. Girls during adolescence. Women who are pregnant or breastfeeding. Women after their menstrual cycle stops (postmenopause). Women whose menstrual cycle has stopped due to anorexia nervosa or regular intense exercise. People who cannot eat or digest dairy products. Vegans. Recommended daily amounts of calcium: Women (ages 19 to 50): 1,000 mg per day. Women (ages 51 and older): 1,200 mg per day. Men (ages 19 to 70): 1,000 mg per day. Men (ages 71 and older): 1,200 mg per day. Women (ages 9 to 18): 1,300 mg per day. Men (ages 9 to 18): 1,300 mg per day. General information Eat foods that are high in calcium. Try to get most of your calcium from food. Some people may benefit from taking calcium supplements. Check with your health care provider or diet and nutrition specialist (dietitian) before starting any calcium supplements. Calcium supplements may interact with certain medicines. Too much calcium may cause other health problems, such as constipation and kidney stones. For the body to absorb calcium, it needs vitamin D. Sources of vitamin D include: Skin exposure to direct sunlight. Foods, such as egg yolks, liver, mushrooms, saltwater fish, and fortified milk. Vitamin D supplements. Check with your health care provider or dietitian before starting any vitamin D supplements. What foods are high in calcium?  Foods that are high in calcium contain more than 100 milligrams per serving. Fruits Fortified orange juice or other fruit juice, 300 mg per 8 oz serving. Vegetables Collard greens, 360 mg per 8 oz serving. Kale, 100 mg per 8 oz serving. Bok choy, 160 mg per 8 oz serving. Grains Fortified ready-to-eat cereals, 100 to 1,000 mg per 8 oz serving. Fortified frozen waffles, 200 mg in 2 waffles. Oatmeal, 140 mg in  1 cup. Meats and other proteins Sardines, canned with bones, 325 mg per 3 oz serving. Salmon, canned with bones, 180 mg per 3 oz serving. Canned shrimp, 125 mg per 3 oz serving. Baked beans, 160 mg per 4 oz serving. Tofu, firm, made with calcium sulfate, 253 mg per 4 oz serving. Dairy Yogurt, plain, low-fat, 310 mg per 6 oz serving. Nonfat milk, 300 mg per 8 oz serving. American cheese, 195 mg per 1 oz serving. Cheddar cheese, 205 mg per 1 oz serving. Cottage cheese 2%, 105 mg per 4 oz serving. Fortified soy, rice, or almond milk, 300 mg per 8 oz serving. Mozzarella, part skim, 210 mg per 1 oz serving. The items listed above may not be a complete list of foods high in calcium. Actual amounts of calcium may be different depending on processing. Contact a dietitian for more information. What foods are lower in calcium? Foods that are lower in calcium contain 50 mg or less per serving. Fruits Apple, about 6 mg. Banana, about 12 mg.   Vegetables Lettuce, 19 mg per 2 oz serving. Tomato, about 11 mg. Grains Rice, 4 mg per 6 oz serving. Boiled potatoes, 14 mg per 8 oz serving. White bread, 6 mg per slice. Meats and other proteins Egg, 27 mg per 2 oz serving. Red meat, 7 mg per 4 oz serving. Chicken, 17 mg per 4 oz serving. Fish, cod, or trout, 20 mg per 4 oz serving. Dairy Cream cheese, regular, 14 mg per 1 Tbsp serving. Brie cheese, 50 mg per 1 oz serving. Parmesan cheese, 70 mg per 1 Tbsp serving. The items listed above may not be a complete list of foods lower in calcium. Actual amounts of calcium may be different depending on processing. Contact a dietitian for more information. Summary Calcium is an important mineral in the body because it affects many functions. Getting enough calcium helps support strong bones and teeth throughout your life. Try to get most of your calcium from food. Calcium supplements may interact with certain medicines. Check with your health care provider  or dietitian before starting any calcium supplements. This information is not intended to replace advice given to you by your health care provider. Make sure you discuss any questions you have with your health care provider. Document Revised: 12/01/2019 Document Reviewed: 12/01/2019 Elsevier Patient Education  2023 Elsevier Inc.  

## 2022-06-07 LAB — CYTOLOGY - PAP
Comment: NEGATIVE
High risk HPV: POSITIVE — AB

## 2022-06-10 ENCOUNTER — Other Ambulatory Visit: Payer: Self-pay

## 2022-06-10 DIAGNOSIS — R87618 Other abnormal cytological findings on specimens from cervix uteri: Secondary | ICD-10-CM

## 2022-06-10 DIAGNOSIS — R87612 Low grade squamous intraepithelial lesion on cytologic smear of cervix (LGSIL): Secondary | ICD-10-CM

## 2022-06-24 ENCOUNTER — Encounter: Payer: Self-pay | Admitting: Obstetrics and Gynecology

## 2022-06-25 ENCOUNTER — Other Ambulatory Visit (HOSPITAL_COMMUNITY)
Admission: RE | Admit: 2022-06-25 | Discharge: 2022-06-25 | Disposition: A | Discharge status: Home / Self Care | Payer: 59 | Source: Ambulatory Visit | Attending: Obstetrics and Gynecology | Admitting: Obstetrics and Gynecology

## 2022-06-25 ENCOUNTER — Encounter: Payer: Self-pay | Admitting: Obstetrics and Gynecology

## 2022-06-25 ENCOUNTER — Ambulatory Visit: Payer: 59 | Admitting: Obstetrics and Gynecology

## 2022-06-25 VITALS — BP 110/70 | HR 78 | Resp 16

## 2022-06-25 DIAGNOSIS — R87618 Other abnormal cytological findings on specimens from cervix uteri: Secondary | ICD-10-CM

## 2022-06-25 DIAGNOSIS — Z01812 Encounter for preprocedural laboratory examination: Secondary | ICD-10-CM | POA: Diagnosis not present

## 2022-06-25 DIAGNOSIS — R87612 Low grade squamous intraepithelial lesion on cytologic smear of cervix (LGSIL): Secondary | ICD-10-CM | POA: Insufficient documentation

## 2022-06-25 DIAGNOSIS — N760 Acute vaginitis: Secondary | ICD-10-CM

## 2022-06-25 LAB — PREGNANCY, URINE: Preg Test, Ur: NEGATIVE

## 2022-06-25 LAB — WET PREP FOR TRICH, YEAST, CLUE

## 2022-06-25 NOTE — Progress Notes (Signed)
  Subjective:     Patient ID: Hannah Pham, female   DOB: August 07, 1996, 26 y.o.   MRN: [id number]  HPI PAP: 06-05-22 lgsil HPV HR +  She notes some itching and dryness and odor.  She is concerned about BV and yeast.   Contraception: condoms most of the time UPT- neg LMP: 06-15-22 Gardasil:  completed.  Review of Systems  Constitutional: Negative.   HENT: Negative.    Eyes: Negative.   Respiratory: Negative.    Cardiovascular: Negative.   Gastrointestinal: Negative.   Endocrine: Negative.   Genitourinary: Negative.   Musculoskeletal: Negative.   Skin: Negative.   Allergic/Immunologic: Negative.   Neurological: Negative.   Hematological: Negative.   Psychiatric/Behavioral: Negative.         Objective:   Physical Exam Genitourinary:     Colposcopy - cervix, vagina. Consent for procedure.  3% acetic acid used in vagina. White light and green light filter used.  Colposcopy satisfactory:  Yes   __x___          No    _____ Findings:    Cervix: islands of acetowhite change at 7:00 and 4:00.  Vagina: no lesions. . Biopsies:  ECC, 7:00, and 4:00 all to pathology separately Monsel's placed.  Minimal EBL. No complications.    Assessment:     LGSIL Positive HR HPV.  Acute vaginitis symptoms with negative wet prep prior to colposcopy today.    Plan:     Wet prep:  negative for yeast, clue cells, and trichomonas.  Ok to proceed with colposcopy and biopsy. FU biopsies.  At a minimum will plan for pap and HR HPV in one year.   In addition to colposcopy with biopsy today, the patient was evaluated for acute vaginitis.

## 2022-06-25 NOTE — Patient Instructions (Signed)
Colposcopy, Care After  The following information offers guidance on how to care for yourself after your procedure. Your health care provider may also give you more specific instructions. If you have problems or questions, contact your health care provider. What can I expect after the procedure? If you had a colposcopy without a biopsy, you can expect to feel fine right away after your procedure. However, you may have some spotting of blood for a few days. You can return to your normal activities. If you had a colposcopy with a biopsy, it is common after the procedure to have: Soreness and mild pain. These may last for a few days. Mild vaginal bleeding or discharge that is dark-colored and grainy. This may last for a few days. The discharge may be caused by a liquid (solution) that was used during the procedure. You may need to wear a sanitary pad during this time. Spotting of blood for at least 48 hours after the procedure. Follow these instructions at home: Medicines Take over-the-counter and prescription medicines only as told by your health care provider. Talk with your health care provider about what type of over-the-counter pain medicines and prescription medicines you can start to take again. It is especially important to talk with your health care provider if you take blood thinners. Activity Avoid using douche products, using tampons, and having sex for at least 3 days after the procedure or for as long as told by your health care provider. Return to your normal activities as told by your health care provider. Ask your health care provider what activities are safe for you. General instructions Ask your health care provider if you may take baths, swim, or use a hot tub. You may take showers. If you use birth control (contraception), continue to use it. Keep all follow-up visits. This is important. Contact a health care provider if: You have a fever or chills. You faint or feel  light-headed. Get help right away if: You have heavy bleeding from your vagina or pass blood clots. Heavy bleeding is bleeding that soaks through a sanitary pad in less than 1 hour. You have vaginal discharge that is abnormal, is yellow in color, or smells bad. This could be a sign of infection. You have severe pain or cramps in your lower abdomen that do not go away with medicine. Summary If you had a colposcopy without a biopsy, you can expect to feel fine right away, but you may have some spotting of blood for a few days. You can return to your normal activities. If you had a colposcopy with a biopsy, it is common to have mild pain for a few days and spotting for 48 hours after the procedure. Avoid using douche products, using tampons, and having sex for at least 3 days after the procedure or for as long as told by your health care provider. Get help right away if you have heavy bleeding, severe pain, or signs of infection. This information is not intended to replace advice given to you by your health care provider. Make sure you discuss any questions you have with your health care provider. Document Revised: 12/31/2020 Document Reviewed: 12/31/2020 Elsevier Patient Education  2023 Elsevier Inc.  

## 2022-06-27 LAB — SURGICAL PATHOLOGY
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
## 2022-08-21 ENCOUNTER — Ambulatory Visit: Payer: 59 | Admitting: Obstetrics and Gynecology

## 2022-08-21 ENCOUNTER — Encounter: Payer: Self-pay | Admitting: Obstetrics and Gynecology

## 2022-08-21 VITALS — BP 122/80 | HR 92 | Ht 65.0 in | Wt 117.0 lb

## 2022-08-21 DIAGNOSIS — N760 Acute vaginitis: Secondary | ICD-10-CM

## 2022-08-21 LAB — WET PREP FOR TRICH, YEAST, CLUE

## 2022-08-21 MED ORDER — METRONIDAZOLE 0.75 % VA GEL: 1.0000 | Freq: Every day | VAGINAL | 0 refills | Status: DC

## 2022-08-21 NOTE — Patient Instructions (Signed)

## 2022-12-26 ENCOUNTER — Encounter: Payer: Self-pay | Admitting: Obstetrics and Gynecology

## 2022-12-26 ENCOUNTER — Ambulatory Visit: Payer: 59 | Admitting: Obstetrics and Gynecology

## 2022-12-26 ENCOUNTER — Other Ambulatory Visit (HOSPITAL_COMMUNITY)
Admission: RE | Admit: 2022-12-26 | Discharge: 2022-12-26 | Disposition: A | Discharge status: Home / Self Care | Payer: 59 | Source: Ambulatory Visit | Attending: Obstetrics and Gynecology | Admitting: Obstetrics and Gynecology

## 2022-12-26 VITALS — BP 108/62 | Wt 119.0 lb

## 2022-12-26 DIAGNOSIS — N760 Acute vaginitis: Secondary | ICD-10-CM

## 2022-12-26 DIAGNOSIS — R350 Frequency of micturition: Secondary | ICD-10-CM | POA: Diagnosis not present

## 2022-12-26 DIAGNOSIS — Z113 Encounter for screening for infections with a predominantly sexual mode of transmission: Secondary | ICD-10-CM | POA: Insufficient documentation

## 2022-12-26 LAB — URINALYSIS, COMPLETE W/RFL CULTURE
Bacteria, UA: NONE SEEN /HPF
Bilirubin Urine: NEGATIVE
Casts: NONE SEEN /LPF
Crystals: NONE SEEN /HPF
Glucose, UA: NEGATIVE
Hgb urine dipstick: NEGATIVE
Hyaline Cast: NONE SEEN /LPF
Ketones, ur: NEGATIVE
Leukocyte Esterase: NEGATIVE
Nitrites, Initial: NEGATIVE
Protein, ur: NEGATIVE
RBC / HPF: NONE SEEN /HPF (ref 0–2)
Specific Gravity, Urine: 1.01 (ref 1.001–1.035)
WBC, UA: NONE SEEN /HPF (ref 0–5)
Yeast: NONE SEEN /HPF
pH: 7 (ref 5.0–8.0)

## 2022-12-26 LAB — WET PREP FOR TRICH, YEAST, CLUE

## 2022-12-26 LAB — NO CULTURE INDICATED

## 2022-12-26 MED ORDER — METRONIDAZOLE 0.75 % VA GEL: 1.0000 | Freq: Every day | VAGINAL | 0 refills | Status: DC

## 2022-12-26 NOTE — Telephone Encounter (Signed)
Per BS: "Please contact the patient's pharmacy to understand why they are declining the medication for the patient.    I did send in two separate prescriptions for the Metrogel.  The first did not specify 5 nights of treatment, so I cancelled it and resent it in.  Thanks,  [employer]"   Spoke w/ Marchelle Folks and she states that the [employer] for the 5 nights is there and ready for pick up. Will advise pt then route to provider for final review.
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
## 2022-12-27 LAB — CERVICOVAGINAL ANCILLARY ONLY
Chlamydia: NEGATIVE
Comment: NEGATIVE
Comment: NORMAL
Neisseria Gonorrhea: NEGATIVE

## 2023-01-07 ENCOUNTER — Ambulatory Visit: Payer: 59 | Admitting: Internal Medicine

## 2023-01-07 VITALS — BP 110/70 | HR 88 | Temp 98.4°F | Wt 116.0 lb

## 2023-01-07 DIAGNOSIS — R42 Dizziness and giddiness: Secondary | ICD-10-CM

## 2023-01-07 DIAGNOSIS — H6993 Unspecified Eustachian tube disorder, bilateral: Secondary | ICD-10-CM | POA: Diagnosis not present

## 2023-01-07 DIAGNOSIS — R4781 Slurred speech: Secondary | ICD-10-CM

## 2023-01-07 DIAGNOSIS — R400 Somnolence: Secondary | ICD-10-CM
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
## 2023-01-09 ENCOUNTER — Ambulatory Visit: Payer: Self-pay

## 2023-01-09 ENCOUNTER — Ambulatory Visit (HOSPITAL_COMMUNITY): Payer: Self-pay

## 2023-01-10 ENCOUNTER — Ambulatory Visit: Payer: 59 | Admitting: Family Medicine

## 2023-01-10 VITALS — BP 118/76 | HR 100 | Temp 98.5°F | Wt 117.0 lb

## 2023-01-10 DIAGNOSIS — R251 Tremor, unspecified: Secondary | ICD-10-CM

## 2023-01-10 DIAGNOSIS — R519 Headache, unspecified: Secondary | ICD-10-CM | POA: Diagnosis not present

## 2023-01-10 DIAGNOSIS — R2689 Other abnormalities of gait and mobility: Secondary | ICD-10-CM | POA: Diagnosis not present

## 2023-01-10 DIAGNOSIS — R4189 Other symptoms and signs involving cognitive functions and awareness: Secondary | ICD-10-CM

## 2023-01-10 DIAGNOSIS — R4789 Other speech disturbances: Secondary | ICD-10-CM

## 2023-01-10 DIAGNOSIS — R42 Dizziness and giddiness: Secondary | ICD-10-CM

## 2023-01-10 DIAGNOSIS — H6993 Unspecified Eustachian tube disorder, bilateral: Secondary | ICD-10-CM

## 2023-01-10 DIAGNOSIS — R4781 Slurred speech: Secondary | ICD-10-CM | POA: Diagnosis not present

## 2023-01-10 DIAGNOSIS — F82 Specific developmental disorder of motor function: Secondary | ICD-10-CM

## 2023-01-10 LAB — CBC WITH DIFFERENTIAL/PLATELET
Basophils Absolute: 0 10*3/uL (ref 0.0–0.1)
Basophils Relative: 0.3 % (ref 0.0–3.0)
Eosinophils Absolute: 0.1 10*3/uL (ref 0.0–0.7)
Eosinophils Relative: 0.7 % (ref 0.0–5.0)
HCT: 39.5 % (ref 36.0–46.0)
Hemoglobin: 13.5 g/dL (ref 12.0–15.0)
Lymphocytes Relative: 17 % (ref 12.0–46.0)
Lymphs Abs: 1.2 10*3/uL (ref 0.7–4.0)
MCHC: 34.1 g/dL (ref 30.0–36.0)
MCV: 93.6 fl (ref 78.0–100.0)
Monocytes Absolute: 0.5 10*3/uL (ref 0.1–1.0)
Monocytes Relative: 6.2 % (ref 3.0–12.0)
Neutro Abs: 5.6 10*3/uL (ref 1.4–7.7)
Neutrophils Relative %: 75.8 % (ref 43.0–77.0)
Platelets: 255 10*3/uL (ref 150.0–400.0)
RBC: 4.22 Mil/uL (ref 3.87–5.11)
RDW: 12.3 % (ref 11.5–15.5)
WBC: 7.3 10*3/uL (ref 4.0–10.5)

## 2023-01-10 LAB — COMPREHENSIVE METABOLIC PANEL
ALT: 9 U/L (ref 0–35)
AST: 16 U/L (ref 0–37)
Albumin: 4.6 g/dL (ref 3.5–5.2)
Alkaline Phosphatase: 59 U/L (ref 39–117)
BUN: 10 mg/dL (ref 6–23)
CO2: 26 mEq/L (ref 19–32)
Calcium: 9.4 mg/dL (ref 8.4–10.5)
Chloride: 103 mEq/L (ref 96–112)
Creatinine, Ser: 0.65 mg/dL (ref 0.40–1.20)
GFR: 120.39 mL/min (ref >60.00)
Glucose, Bld: 92 mg/dL (ref 70–99)
Potassium: 4.1 mEq/L (ref 3.5–5.1)
Sodium: 140 mEq/L (ref 135–145)
Total Bilirubin: 0.7 mg/dL (ref 0.2–1.2)
Total Protein: 7.5 g/dL (ref 6.0–8.3)

## 2023-01-10 LAB — TSH: TSH: 1.49 u[IU]/mL (ref 0.35–5.50)

## 2023-01-10 LAB — FOLATE: Folate: 20.8 ng/mL (ref >5.9)

## 2023-01-10 LAB — SEDIMENTATION RATE: Sed Rate: 4 mm/hr (ref 0–20)

## 2023-01-10 LAB — T4, FREE: Free T4: 0.76 ng/dL (ref 0.60–1.60)

## 2023-01-10 LAB — MAGNESIUM: Magnesium: 2 mg/dL (ref 1.5–2.5)

## 2023-01-10 LAB — VITAMIN B12: Vitamin B-12: 311 pg/mL (ref 211–911)

## 2023-01-10 NOTE — Patient Instructions (Signed)
Referral for imaging of your brain and neurology were placed.  You should expect a phone call about scheduling these appointments.  We will obtain labs while you are here in clinic.  If you have any worsening of your symptoms over the weekend proceed to nearest emergency department.

## 2023-01-10 NOTE — Progress Notes (Signed)
Established Patient Office Visit   Subjective  Patient ID: Hannah Pham, female    DOB: 20-Nov-1995  Age: 27 y.o. MRN: [id number]  Chief Complaint  Patient presents with  . Medical Management of Chronic Issues    Saw PCP on 5/21, Taking zyrtec, but still feels the same. Still not able to control motor skills, is dropping things often -hands are not doing what she wants them to do, muscle spasms in legs, slurred speech, feels like she could fall asleep at any moment    Pt is a 27 yo female followed by Dr. Ardyth Harps.  Patient seen 5/21 for symptoms of dizziness   {History (Optional):23778}  ROS Negative unless stated above    Objective:     BP 118/76 (BP Location: Right Arm, Patient Position: Sitting, Cuff Size: Normal)   Pulse 100   Temp 98.5 F (36.9 C) (Oral)   Wt 117 lb (53.1 kg)   LMP 12/17/2022 (Exact Date)   SpO2 94%   BMI 19.47 kg/m  {Vitals History (Optional):23777}  Physical Exam   No results found for any visits on 01/10/23.    Assessment & Plan:  Slurred speech -     Comprehensive metabolic panel -     CBC with Differential/Platelet -     MR BRAIN W WO CONTRAST; Future -     Ambulatory referral to Neurology  Dizziness -     Comprehensive metabolic panel -     CBC with Differential/Platelet -     Iron, TIBC and Ferritin Panel -     Vitamin B12 -     TSH -     T4, free -     MR BRAIN W WO CONTRAST; Future -     Ambulatory referral to Neurology  Acute nonintractable headache, unspecified headache type -     MR BRAIN W WO CONTRAST; Future -     Ambulatory referral to Neurology  Brain fog -     Comprehensive metabolic panel -     CBC with Differential/Platelet -     Iron, TIBC and Ferritin Panel -     Vitamin B12 -     TSH -     Sedimentation rate -     T4, free -     Folate -     Magnesium -     Lupus (SLE) Analysis -     MR BRAIN W WO CONTRAST; Future -     Ambulatory referral to Neurology  Word finding difficulty -     MR BRAIN W WO  CONTRAST; Future -     Ambulatory referral to Neurology  Balance problem -     Comprehensive metabolic panel -     Vitamin B12 -     TSH -     T4, free -     Folate -     Magnesium -     MR BRAIN W WO CONTRAST; Future -     Ambulatory referral to Neurology  Dysfunction of both eustachian tubes  Tremor -     Comprehensive metabolic panel -     CBC with Differential/Platelet -     Iron, TIBC and Ferritin Panel -     Vitamin B12 -     TSH -     Sedimentation rate -     T4, free -     Folate -     Magnesium -     Lupus (SLE) Analysis -  MR BRAIN W WO CONTRAST; Future -     Ambulatory referral to Neurology  Motor skill disorder -     Comprehensive metabolic panel -     CBC with Differential/Platelet -     Iron, TIBC and Ferritin Panel -     Vitamin B12 -     TSH -     Sedimentation rate -     T4, free -     Folate -     Magnesium -     Lupus (SLE) Analysis -     MR BRAIN W WO CONTRAST; Future -     Ambulatory referral to Neurology    Return if symptoms worsen or fail to improve.   Deeann Saint, MD

## 2023-01-11 LAB — IRON,TIBC AND FERRITIN PANEL
%SAT: 30 % (calc) (ref 16–45)
Ferritin: 27 ng/mL (ref 16–154)
Iron: 103 ug/dL (ref 40–190)
TIBC: 339 mcg/dL (calc) (ref 250–450)

## 2023-01-14 ENCOUNTER — Encounter: Payer: Self-pay | Admitting: Family Medicine

## 2023-01-17 ENCOUNTER — Ambulatory Visit
Admission: RE | Admit: 2023-01-17 | Discharge: 2023-01-17 | Disposition: A | Discharge status: Home / Self Care | Payer: 59 | Source: Ambulatory Visit | Attending: Family Medicine | Admitting: Family Medicine

## 2023-01-17 DIAGNOSIS — F82 Specific developmental disorder of motor function: Secondary | ICD-10-CM

## 2023-01-17 DIAGNOSIS — R519 Headache, unspecified: Secondary | ICD-10-CM

## 2023-01-17 DIAGNOSIS — R4789 Other speech disturbances: Secondary | ICD-10-CM

## 2023-01-17 DIAGNOSIS — R2689 Other abnormalities of gait and mobility: Secondary | ICD-10-CM

## 2023-01-17 DIAGNOSIS — R4189 Other symptoms and signs involving cognitive functions and awareness: Secondary | ICD-10-CM

## 2023-01-17 DIAGNOSIS — R42 Dizziness and giddiness: Secondary | ICD-10-CM

## 2023-01-17 DIAGNOSIS — R251 Tremor, unspecified: Secondary | ICD-10-CM

## 2023-01-17 DIAGNOSIS — R4781 Slurred speech: Secondary | ICD-10-CM

## 2023-01-17 LAB — LUPUS (SLE) ANALYSIS
Anti Nuclear Antibody (ANA): NEGATIVE
Anti-striation Abs: NEGATIVE
Complement C4, Serum: 19 mg/dL (ref 12–38)
ENA RNP Ab: <0.2 AI (ref 0.0–0.9)
ENA SM Ab Ser-aCnc: <0.2 AI (ref 0.0–0.9)
ENA SSA (RO) Ab: <0.2 AI (ref 0.0–0.9)
ENA SSB (LA) Ab: <0.2 AI (ref 0.0–0.9)
Mitochondrial Ab: <20 Units (ref 0.0–20.0)
Parietal Cell Ab: 52.1 Units — ABNORMAL HIGH (ref 0.0–20.0)
Scleroderma (Scl-70) (ENA) Antibody, IgG: <0.2 AI (ref 0.0–0.9)
Smooth Muscle Ab: 8 Units (ref 0–19)
Thyroperoxidase Ab SerPl-aCnc: 16 IU/mL (ref 0–34)
dsDNA Ab: <1 IU/mL (ref 0–9)

## 2023-01-17 MED ORDER — GADOPICLENOL 0.5 MMOL/ML IV SOLN
6.0000 mL | Freq: Once | INTRAVENOUS | Status: AC | PRN
  Administered 2023-01-17: 6 mL via INTRAVENOUS

## 2023-01-20 ENCOUNTER — Ambulatory Visit: Payer: 59 | Admitting: Neurology

## 2023-01-20 ENCOUNTER — Encounter: Payer: Self-pay | Admitting: Neurology

## 2023-01-20 VITALS — BP 114/67 | HR 86 | Ht 63.0 in | Wt 118.0 lb

## 2023-01-20 DIAGNOSIS — R42 Dizziness and giddiness: Secondary | ICD-10-CM | POA: Diagnosis not present

## 2023-01-20 DIAGNOSIS — R519 Headache, unspecified: Secondary | ICD-10-CM

## 2023-01-20 DIAGNOSIS — R4789 Other speech disturbances: Secondary | ICD-10-CM

## 2023-01-20 DIAGNOSIS — H538 Other visual disturbances: Secondary | ICD-10-CM | POA: Diagnosis not present

## 2023-01-20 NOTE — Patient Instructions (Signed)
It was nice to meet you today.  I am glad to hear that you are feeling better. Please discuss with primary care your symptoms of anxiety, treating anxiety may help you feel better quicker. Please try to stay well-hydrated and well rested, drink about 64 ounces of water per day and try to get about 7 to 8 hours of sleep. Please avoid taking ibuprofen every single day, if you need to treat headaches you can alternate with Tylenol and ibuprofen only as needed. Please make an appointment with an eye doctor for formal full eye exam as you have not seen an eye doctor in about 4 years. Please discuss with your primary care the possibility of seeing ENT to make sure you do not have an inner ear problem or vestibular dysfunction.
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
## 2023-02-12 ENCOUNTER — Telehealth: Payer: Self-pay | Admitting: Internal Medicine

## 2023-02-12 DIAGNOSIS — R4781 Slurred speech: Secondary | ICD-10-CM

## 2023-02-12 DIAGNOSIS — R519 Headache, unspecified: Secondary | ICD-10-CM

## 2023-02-12 DIAGNOSIS — H6993 Unspecified Eustachian tube disorder, bilateral: Secondary | ICD-10-CM

## 2023-02-12 DIAGNOSIS — R42 Dizziness and giddiness: Secondary | ICD-10-CM

## 2023-02-12 DIAGNOSIS — R4189 Other symptoms and signs involving cognitive functions and awareness: Secondary | ICD-10-CM

## 2023-02-12 DIAGNOSIS — R2689 Other abnormalities of gait and mobility: Secondary | ICD-10-CM

## 2023-02-12 DIAGNOSIS — R4789 Other speech disturbances: Secondary | ICD-10-CM

## 2023-02-12 NOTE — Telephone Encounter (Signed)
Okay to place referral

## 2023-02-12 NOTE — Telephone Encounter (Signed)
Pt called and stated that she was sent to Neurology from a recent visit with Dr however Neurology is now saying that the Dr needs to refer pt to a ENT instead.   Pt would like a referral placed for an ENT Dr. and a call back to her once referral is placed.   [phone]  Please advise.

## 2023-02-13 NOTE — Telephone Encounter (Signed)
Referral placed.
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
## 2023-06-09 ENCOUNTER — Ambulatory Visit (INDEPENDENT_AMBULATORY_CARE_PROVIDER_SITE_OTHER): Payer: 59 | Admitting: Obstetrics and Gynecology

## 2023-06-09 ENCOUNTER — Other Ambulatory Visit (HOSPITAL_COMMUNITY)
Admission: RE | Admit: 2023-06-09 | Discharge: 2023-06-09 | Disposition: A | Discharge status: Home / Self Care | Payer: 59 | Source: Ambulatory Visit | Attending: Obstetrics and Gynecology | Admitting: Obstetrics and Gynecology

## 2023-06-09 ENCOUNTER — Encounter: Payer: Self-pay | Admitting: Obstetrics and Gynecology

## 2023-06-09 ENCOUNTER — Telehealth: Payer: Self-pay | Admitting: Obstetrics and Gynecology

## 2023-06-09 VITALS — BP 116/64 | Ht 64.25 in | Wt 121.0 lb

## 2023-06-09 DIAGNOSIS — Z124 Encounter for screening for malignant neoplasm of cervix: Secondary | ICD-10-CM | POA: Diagnosis present

## 2023-06-09 DIAGNOSIS — R198 Other specified symptoms and signs involving the digestive system and abdomen: Secondary | ICD-10-CM | POA: Diagnosis not present

## 2023-06-09 DIAGNOSIS — N870 Mild cervical dysplasia: Secondary | ICD-10-CM | POA: Diagnosis present

## 2023-06-09 DIAGNOSIS — N941 Unspecified dyspareunia: Secondary | ICD-10-CM | POA: Diagnosis not present

## 2023-06-09 DIAGNOSIS — N898 Other specified noninflammatory disorders of vagina: Secondary | ICD-10-CM

## 2023-06-09 DIAGNOSIS — Z01419 Encounter for gynecological examination (general) (routine) without abnormal findings: Secondary | ICD-10-CM | POA: Diagnosis not present

## 2023-06-09 LAB — WET PREP FOR TRICH, YEAST, CLUE

## 2023-06-09 MED ORDER — VALACYCLOVIR HCL 500 MG PO TABS: ORAL_TABLET | ORAL | 3 refills | Status: DC

## 2023-06-09 NOTE — Telephone Encounter (Signed)
Order placed.  Routing to [employer].   Encounter closed.

## 2023-06-09 NOTE — Telephone Encounter (Signed)
Please make a referral to [facility] Pelvic Floor therapy.   My patient has painful intercourse.

## 2023-06-09 NOTE — Patient Instructions (Signed)
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
## 2023-06-12 ENCOUNTER — Ambulatory Visit: Payer: 59 | Attending: Obstetrics and Gynecology | Admitting: Physical Therapy

## 2023-06-12 ENCOUNTER — Other Ambulatory Visit: Payer: Self-pay

## 2023-06-12 DIAGNOSIS — M62838 Other muscle spasm: Secondary | ICD-10-CM | POA: Insufficient documentation

## 2023-06-12 DIAGNOSIS — N941 Unspecified dyspareunia: Secondary | ICD-10-CM | POA: Diagnosis not present

## 2023-06-13 ENCOUNTER — Telehealth: Payer: Self-pay | Admitting: Physical Therapy

## 2023-06-13 NOTE — Telephone Encounter (Signed)
Left pt a VM to schedule a PT session after she orders a set of dilators.

## 2023-06-16 LAB — CYTOLOGY - PAP
Comment: NEGATIVE
Diagnosis: UNDETERMINED — AB
High risk HPV: POSITIVE — AB

## 2023-06-17 ENCOUNTER — Other Ambulatory Visit: Payer: Self-pay

## 2023-06-17 ENCOUNTER — Other Ambulatory Visit: Payer: Self-pay | Admitting: *Deleted

## 2023-06-17 DIAGNOSIS — N941 Unspecified dyspareunia: Secondary | ICD-10-CM

## 2023-06-17 DIAGNOSIS — R87618 Other abnormal cytological findings on specimens from cervix uteri: Secondary | ICD-10-CM
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
## 2023-06-27 ENCOUNTER — Ambulatory Visit (HOSPITAL_COMMUNITY)
Admission: RE | Admit: 2023-06-27 | Discharge: 2023-06-27 | Disposition: A | Discharge status: Home / Self Care | Payer: 59 | Source: Ambulatory Visit | Attending: Obstetrics and Gynecology | Admitting: Obstetrics and Gynecology

## 2023-06-27 DIAGNOSIS — N941 Unspecified dyspareunia: Secondary | ICD-10-CM | POA: Diagnosis present

## 2023-07-10 ENCOUNTER — Encounter: Payer: Self-pay | Admitting: Obstetrics and Gynecology

## 2023-07-10 ENCOUNTER — Other Ambulatory Visit (HOSPITAL_COMMUNITY)
Admission: RE | Admit: 2023-07-10 | Discharge: 2023-07-10 | Disposition: A | Discharge status: Home / Self Care | Payer: 59 | Source: Ambulatory Visit | Attending: Obstetrics and Gynecology | Admitting: Obstetrics and Gynecology

## 2023-07-10 ENCOUNTER — Ambulatory Visit (INDEPENDENT_AMBULATORY_CARE_PROVIDER_SITE_OTHER): Payer: 59 | Admitting: Obstetrics and Gynecology

## 2023-07-10 VITALS — BP 116/76 | HR 92 | Ht 64.25 in | Wt 121.0 lb

## 2023-07-10 DIAGNOSIS — N946 Dysmenorrhea, unspecified: Secondary | ICD-10-CM | POA: Diagnosis not present

## 2023-07-10 DIAGNOSIS — R87618 Other abnormal cytological findings on specimens from cervix uteri: Secondary | ICD-10-CM

## 2023-07-10 DIAGNOSIS — R87810 Cervical high risk human papillomavirus (HPV) DNA test positive: Secondary | ICD-10-CM

## 2023-07-10 DIAGNOSIS — Z01812 Encounter for preprocedural laboratory examination: Secondary | ICD-10-CM | POA: Diagnosis not present

## 2023-07-10 DIAGNOSIS — R87610 Atypical squamous cells of undetermined significance on cytologic smear of cervix (ASC-US): Secondary | ICD-10-CM

## 2023-07-10 LAB — PREGNANCY, URINE: Preg Test, Ur: NEGATIVE

## 2023-07-10 NOTE — Patient Instructions (Signed)
Colposcopy, Care After  The following information offers guidance on how to care for yourself after your procedure. Your health care provider may also give you more specific instructions. If you have problems or questions, contact your health care provider. What can I expect after the procedure? If you had a colposcopy without a biopsy, you can expect to feel fine right away after your procedure. However, you may have some spotting of blood for a few days. You can return to your normal activities. If you had a colposcopy with a biopsy, it is common after the procedure to have: Soreness and mild pain. These may last for a few days. Mild vaginal bleeding or discharge that is dark-colored and grainy. This may last for a few days. The discharge may be caused by a liquid (solution) that was used during the procedure. You may need to wear a sanitary pad during this time. Spotting of blood for at least 48 hours after the procedure. Follow these instructions at home: Medicines Take over-the-counter and prescription medicines only as told by your health care provider. Talk with your health care provider about what type of over-the-counter pain medicines and prescription medicines you can start to take again. It is especially important to talk with your health care provider if you take blood thinners. Activity Avoid using douche products, using tampons, and having sex for at least 3 days after the procedure or for as long as told by your health care provider. Return to your normal activities as told by your health care provider. Ask your health care provider what activities are safe for you. General instructions Ask your health care provider if you may take baths, swim, or use a hot tub. You may take showers. If you use birth control (contraception), continue to use it. Keep all follow-up visits. This is important. Contact a health care provider if: You have a fever or chills. You faint or feel  light-headed. Get help right away if: You have heavy bleeding from your vagina or pass blood clots. Heavy bleeding is bleeding that soaks through a sanitary pad in less than 1 hour. You have vaginal discharge that is abnormal, is yellow in color, or smells bad. This could be a sign of infection. You have severe pain or cramps in your lower abdomen that do not go away with medicine. Summary If you had a colposcopy without a biopsy, you can expect to feel fine right away, but you may have some spotting of blood for a few days. You can return to your normal activities. If you had a colposcopy with a biopsy, it is common to have mild pain for a few days and spotting for 48 hours after the procedure. Avoid using douche products, using tampons, and having sex for at least 3 days after the procedure or for as long as told by your health care provider. Get help right away if you have heavy bleeding, severe pain, or signs of infection. This information is not intended to replace advice given to you by your health care provider. Make sure you discuss any questions you have with your health care provider. Document Revised: 12/31/2020 Document Reviewed: 12/31/2020 Elsevier Patient Education  2024 Elsevier Inc.  

## 2023-07-11 LAB — SURGICAL PATHOLOGY

## 2023-08-05 ENCOUNTER — Encounter: Payer: Self-pay | Admitting: Obstetrics and Gynecology

## 2023-08-05 ENCOUNTER — Ambulatory Visit: Payer: 59 | Admitting: Obstetrics and Gynecology

## 2023-08-05 VITALS — BP 112/76 | HR 96 | Wt 120.0 lb

## 2023-08-05 DIAGNOSIS — R300 Dysuria: Secondary | ICD-10-CM | POA: Diagnosis not present

## 2023-08-05 LAB — NO CULTURE INDICATED

## 2023-08-05 LAB — URINALYSIS, COMPLETE W/RFL CULTURE
Bacteria, UA: NONE SEEN /[HPF]
Bilirubin Urine: NEGATIVE
Glucose, UA: NEGATIVE
Hgb urine dipstick: NEGATIVE
Hyaline Cast: NONE SEEN /[LPF]
Ketones, ur: NEGATIVE
Leukocyte Esterase: NEGATIVE
Nitrites, Initial: NEGATIVE
Protein, ur: NEGATIVE
RBC / HPF: NONE SEEN /[HPF] (ref 0–2)
Specific Gravity, Urine: 1.01 (ref 1.001–1.035)
WBC, UA: NONE SEEN /[HPF] (ref 0–5)
pH: 6 (ref 5.0–8.0)
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
## 2023-08-16 IMAGING — US US BREAST*R* LIMITED INC AXILLA
1 series · 2 of 2 positions shown · non-contrast
Comparison: None.

CLINICAL DATA: 25-year-old female presenting with a new lump in the
upper-outer quadrant of both breasts as well as tenderness.

EXAM:
ULTRASOUND OF THE BILATERAL BREAST

[Series 1: us breast*right* limited inc axilla · 0.06mm/px · 2 of 2 slices shown]
[im 1/2]
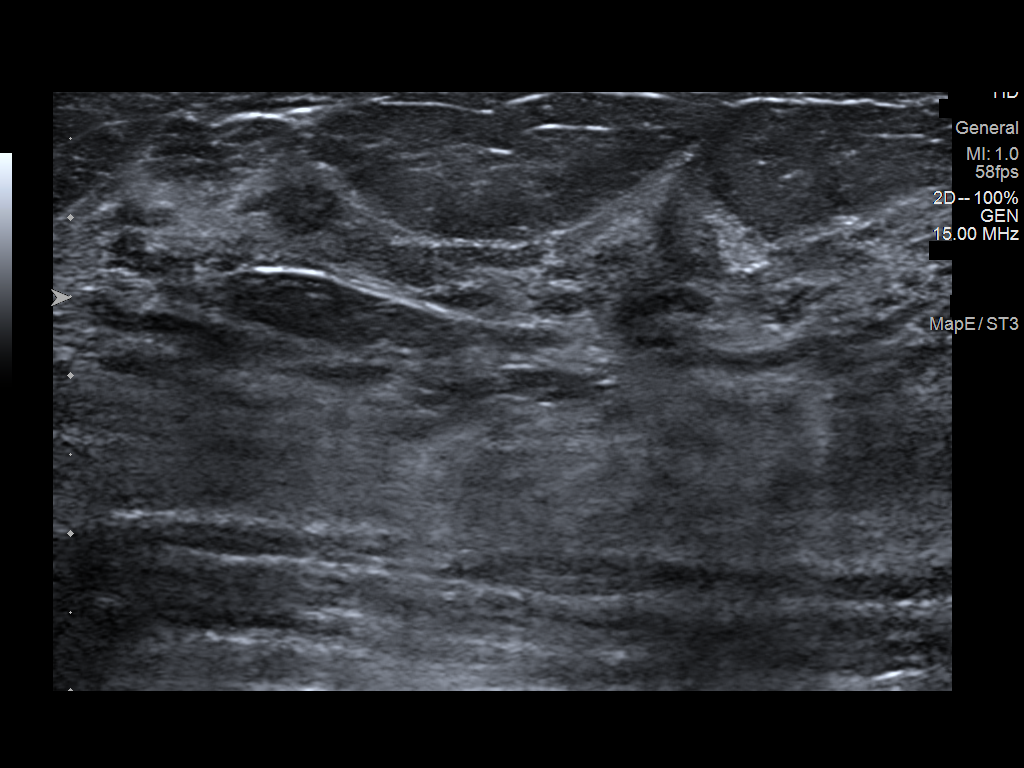
[im 2/2]
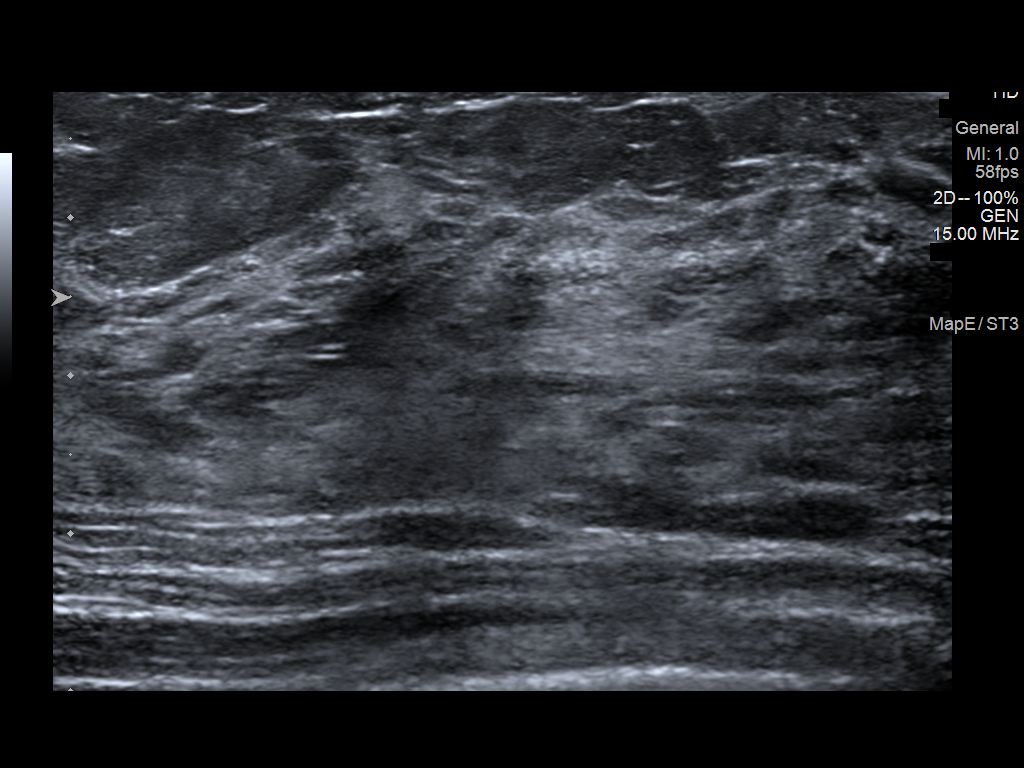

[2 of 2 positions shown; findings below may reference images not displayed]

FINDINGS: Left breast:

On physical exam at the palpable site, I feel an area of dense
tissue without a definite fixed discrete mass.

Targeted ultrasound is performed at the palpable site of concern in
the left breast at 2 o'clock from the nipple demonstrating dense
tissue without a cystic or solid mass.

Right breast:

On physical exam at the palpable site I do not feel a discrete mass
or focal area of thickening.

Targeted ultrasound performed in the right breast at 10 o'clock at
the palpable site of concern demonstrating no cystic or solid mass.
IMPRESSION: No sonographic evidence of malignancy or other imaging abnormality
at the bilateral palpable sites of concern.

RECOMMENDATION:
1. Recommend any further workup of the palpable sites be on a
clinical basis.

2. Breast pain is a common condition, which will often resolve on
its own without intervention. It can be affected by hormonal
changes, medication side effect, weight changes and fit of the bra.
Pain may also be referred from other adjacent areas of the body.
Breast pain may be improved by wearing adequate well-fitting
support, over-the-counter topical and oral NSAID medication, low-fat
diet, and ice/heat as needed. Studies have shown an improvement in
cyclic pain with use of evening primrose oil and vitamin E.

I have discussed the findings and recommendations with the patient.
If applicable, a reminder letter will be sent to the patient
regarding the next appointment.

BI-RADS CATEGORY  1: Negative.

## 2023-08-16 IMAGING — US US BREAST*L* LIMITED INC AXILLA
1 series · 2 of 2 positions shown · non-contrast
Comparison: None.

CLINICAL DATA: 25-year-old female presenting with a new lump in the
upper-outer quadrant of both breasts as well as tenderness.

EXAM:
ULTRASOUND OF THE BILATERAL BREAST

[Series 1: us breast*left* limited inc axilla · 0.06mm/px · 2 of 2 slices shown]
[im 1/2]
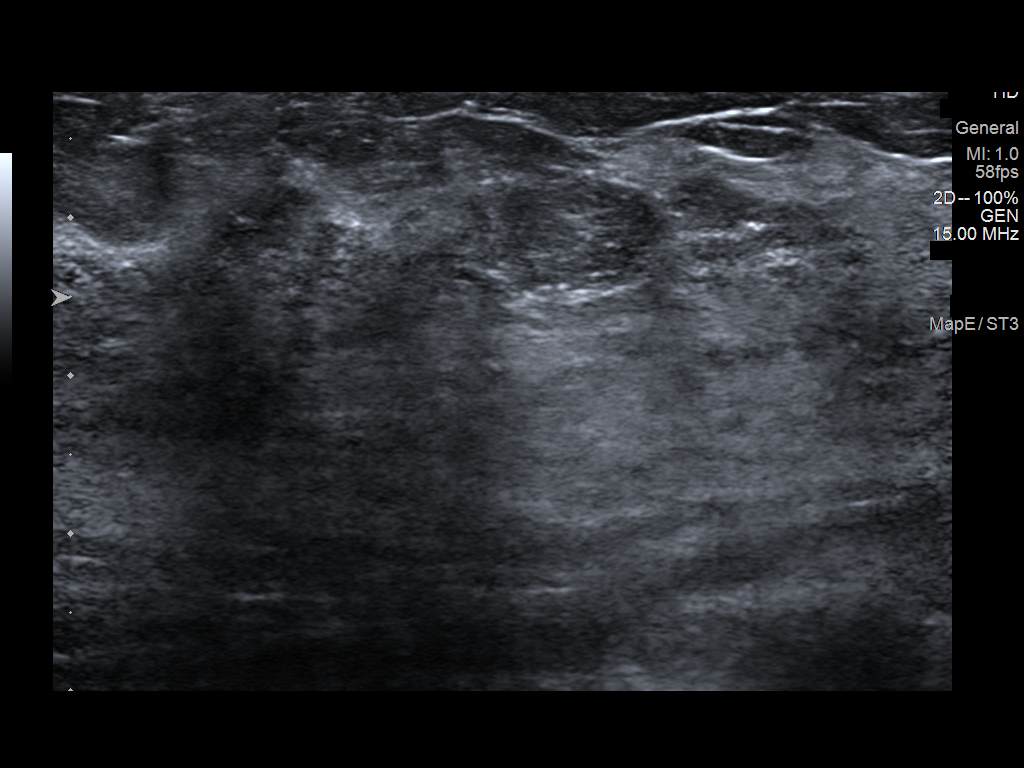
[im 2/2]
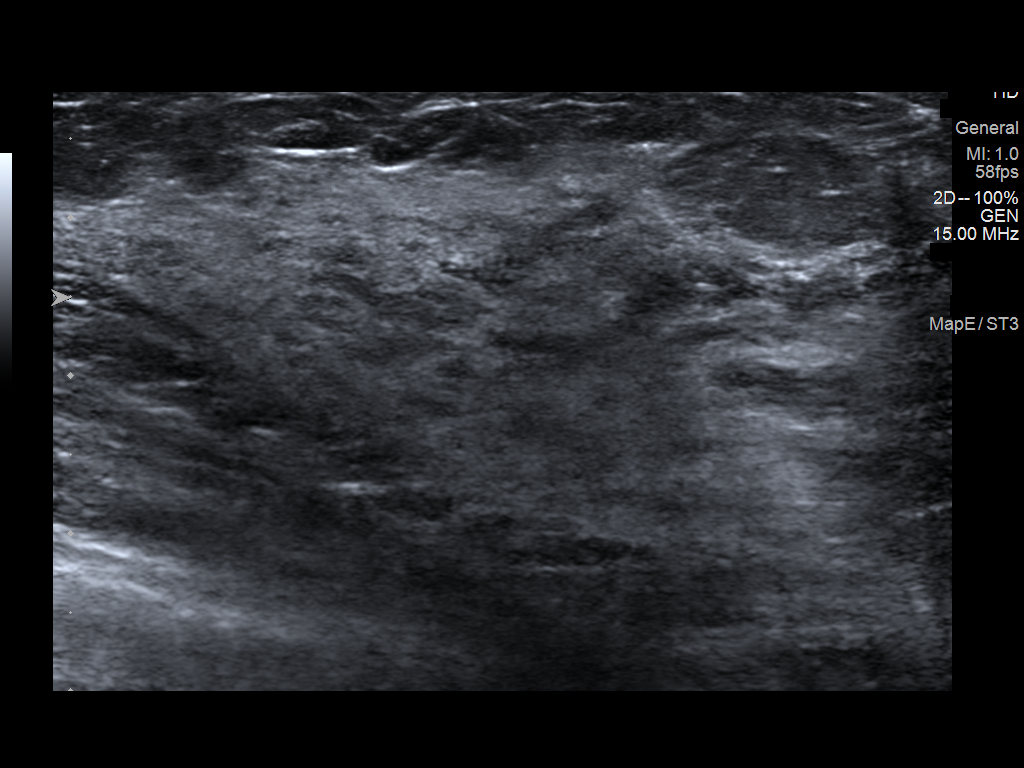

[2 of 2 positions shown; findings below may reference images not displayed]

FINDINGS: Left breast:

On physical exam at the palpable site, I feel an area of dense
tissue without a definite fixed discrete mass.

Targeted ultrasound is performed at the palpable site of concern in
the left breast at 2 o'clock from the nipple demonstrating dense
tissue without a cystic or solid mass.

Right breast:

On physical exam at the palpable site I do not feel a discrete mass
or focal area of thickening.

Targeted ultrasound performed in the right breast at 10 o'clock at
the palpable site of concern demonstrating no cystic or solid mass.
IMPRESSION: No sonographic evidence of malignancy or other imaging abnormality
at the bilateral palpable sites of concern.

RECOMMENDATION:
1. Recommend any further workup of the palpable sites be on a
clinical basis.

2. Breast pain is a common condition, which will often resolve on
its own without intervention. It can be affected by hormonal
changes, medication side effect, weight changes and fit of the bra.
Pain may also be referred from other adjacent areas of the body.
Breast pain may be improved by wearing adequate well-fitting
support, over-the-counter topical and oral NSAID medication, low-fat
diet, and ice/heat as needed. Studies have shown an improvement in
cyclic pain with use of evening primrose oil and vitamin E.

I have discussed the findings and recommendations with the patient.
If applicable, a reminder letter will be sent to the patient
regarding the next appointment.

BI-RADS CATEGORY  1: Negative.

## 2023-11-17 ENCOUNTER — Ambulatory Visit (INDEPENDENT_AMBULATORY_CARE_PROVIDER_SITE_OTHER): Admitting: Internal Medicine

## 2023-11-17 ENCOUNTER — Encounter: Payer: Self-pay | Admitting: Internal Medicine

## 2023-11-17 VITALS — BP 110/70 | HR 85 | Temp 98.3°F | Wt 117.5 lb

## 2023-11-17 DIAGNOSIS — F40243 Fear of flying: Secondary | ICD-10-CM | POA: Diagnosis not present

## 2023-11-17 MED ORDER — ALPRAZOLAM 0.25 MG PO TABS: ORAL_TABLET | ORAL | 0 refills | Status: DC
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
## 2023-11-26 ENCOUNTER — Encounter: Payer: Self-pay | Admitting: Internal Medicine

## 2023-11-26 DIAGNOSIS — F40243 Fear of flying: Secondary | ICD-10-CM

## 2023-11-26 MED ORDER — ALPRAZOLAM 0.25 MG PO TABS: ORAL_TABLET | ORAL | 0 refills | Status: DC

## 2023-12-25 NOTE — Addendum Note (Signed)
 Addended by: Nicolina Barrios B on: 12/25/2023 04:40 PM   Modules accepted: Orders

## 2023-12-29 MED ORDER — ALPRAZOLAM 0.25 MG PO TABS: ORAL_TABLET | ORAL | 0 refills | Status: AC

## 2024-01-14 ENCOUNTER — Encounter: Payer: Self-pay | Admitting: Nurse Practitioner

## 2024-01-14 ENCOUNTER — Ambulatory Visit: Admitting: Nurse Practitioner

## 2024-01-14 VITALS — BP 108/60 | HR 90

## 2024-01-14 DIAGNOSIS — N760 Acute vaginitis: Secondary | ICD-10-CM | POA: Diagnosis not present

## 2024-01-14 DIAGNOSIS — N898 Other specified noninflammatory disorders of vagina: Secondary | ICD-10-CM

## 2024-01-14 DIAGNOSIS — B9689 Other specified bacterial agents as the cause of diseases classified elsewhere: Secondary | ICD-10-CM

## 2024-01-14 LAB — WET PREP FOR TRICH, YEAST, CLUE

## 2024-01-14 MED ORDER — METRONIDAZOLE 500 MG PO TABS: 500.0000 mg | ORAL_TABLET | Freq: Two times a day (BID) | ORAL | 0 refills | Status: DC

## 2024-01-14 NOTE — Progress Notes (Signed)
   Acute Office Visit  Subjective:    Patient ID: Hannah Pham, female    DOB: 1996/01/16, 28 y.o.   MRN: [id number]   HPI 28 y.o. presents today for vaginal itching, odor and urinary frequency that started last week. Went swimming and feels this happens more during the summer.   Patient's last menstrual period was 12/15/2023 (approximate). Period Cycle (Days): 28 Period Duration (Days): 4 Period Pattern: Regular Menstrual Flow: Moderate Menstrual Control: Maxi pad Dysmenorrhea: (!) Severe Dysmenorrhea Symptoms: Cramping  Review of Systems  Constitutional: Negative.   Genitourinary:  Positive for frequency, vaginal discharge and vaginal pain (Itching). Negative for difficulty urinating, dysuria, flank pain, genital sores, hematuria, pelvic pain and urgency.       Vaginal odor       Objective:     Physical Exam Constitutional:      Appearance: Normal appearance.  Genitourinary:    General: Normal vulva.     Vagina: No vaginal discharge or erythema.     BP 108/60 (BP Location: Left Arm, Patient Position: Sitting)   Pulse 90   LMP 12/15/2023 (Approximate)   SpO2 98%  Wt Readings from Last 3 Encounters:  11/17/23 117 lb 8 oz (53.3 kg)  08/05/23 120 lb (54.4 kg)  07/10/23 121 lb (54.9 kg)        Doria Garden, NP student present as chaperone.   Wet prep + clue cells (+ odor)  Assessment & Plan:   Problem List Items Addressed This Visit   None Visit Diagnoses       Bacterial vaginosis    -  Primary   Relevant Medications   metroNIDAZOLE  (FLAGYL ) 500 MG tablet     Vaginal odor       Relevant Orders   WET PREP FOR TRICH, YEAST, CLUE      Plan: Wet prep positive for clue cells - Flagyl  500 mg BID x 7 days. Recommend changing out of wet bathing suits and sweaty clothes, take women's probiotic, avoid scented soaps.   Return if symptoms worsen or fail to improve.    Andee Bamberger DNP, 9:25 AM 01/14/2024

## 2024-01-21 ENCOUNTER — Encounter: Payer: Self-pay | Admitting: Obstetrics and Gynecology

## 2024-01-21 NOTE — Telephone Encounter (Signed)
 Appointment scheduled for 01-22-24

## 2024-01-22 ENCOUNTER — Ambulatory Visit: Admitting: Obstetrics and Gynecology

## 2024-01-22 VITALS — BP 108/64 | HR 78 | Wt 120.2 lb

## 2024-01-22 DIAGNOSIS — N9489 Other specified conditions associated with female genital organs and menstrual cycle: Secondary | ICD-10-CM | POA: Diagnosis not present

## 2024-01-22 MED ORDER — FLUCONAZOLE 150 MG PO TABS: 150.0000 mg | ORAL_TABLET | Freq: Once | ORAL | 0 refills | Status: AC

## 2024-01-22 MED ORDER — PHEXXI 1.8-1-0.4 % VA GEL: 1.0000 | Freq: Once | VAGINAL | 12 refills | Status: AC

## 2024-01-22 NOTE — Progress Notes (Signed)
   Acute Office Visit  Subjective:    Patient ID: Hannah Pham, female    DOB: 07-11-1996, 28 y.o.   MRN: [id number]   HPI 28 y.o. presents today for Vaginitis (She is having some itching and dryness. ) .  Treated for BV recently and having some vaginal itching. Not sure if it is from her cycle as well. Has 3-4 bv infections a year as well Leaving for [place] tomorrow and wants to make sure she doesn't have a yeast infection  Patient's last menstrual period was 01/18/2024 (approximate).    Review of Systems     Objective:     OBGyn Exam  BP 108/64   Pulse 78   Wt 120 lb 3.2 oz (54.5 kg)   LMP 01/18/2024 (Approximate)   SpO2 100%   BMI 20.47 kg/m  Wt Readings from Last 3 Encounters:  01/22/24 120 lb 3.2 oz (54.5 kg)  11/17/23 117 lb 8 oz (53.3 kg)  08/05/23 120 lb (54.4 kg)        Patient informed chaperone available to be present for breast and/or pelvic exam. Patient has requested no chaperone to be present. Patient has been advised what will be completed during breast and pelvic exam.   Assessment & Plan:  Vaginal pruritus, reccurrent BV Mycoplasma urea sent Nu swab sent.  Diflucan  sent to have on hand since she is leaving soon, in case it shows yeast. Discussed Phexxi birth control before intercourse not only for birth control but for PH balance.    Dr. Elisha Guillaume Hyacinth Mail

## 2024-01-23 LAB — SURESWAB® ADVANCED VAGINITIS PLUS,TMA
C. trachomatis RNA, TMA: NOT DETECTED
CANDIDA SPECIES: NOT DETECTED
Candida glabrata: NOT DETECTED
N. gonorrhoeae RNA, TMA: NOT DETECTED
SURESWAB(R) ADV BACTERIAL VAGINOSIS(BV),TMA: NEGATIVE
TRICHOMONAS VAGINALIS (TV),TMA: NOT DETECTED

## 2024-01-24 ENCOUNTER — Ambulatory Visit: Payer: Self-pay | Admitting: Obstetrics and Gynecology

## 2024-01-26 ENCOUNTER — Telehealth: Payer: Self-pay

## 2024-01-26 LAB — MYCOPLASMA / UREAPLASMA CULTURE

## 2024-01-26 NOTE — Telephone Encounter (Signed)
 PA initiation received on the pt for phexxi  rx. Key: [id number]

## 2024-01-28 ENCOUNTER — Telehealth: Payer: Self-pay

## 2024-01-28 NOTE — Telephone Encounter (Signed)
 Prior authorization request received from covermymeds for Phexxi .  PA initiated.  Patient notified. KEY: [id number] Dx: contraception

## 2024-01-28 NOTE — Progress Notes (Signed)
 Patient left message on triage line stating is in [place], request communication through [place]. States she took Fluconazole  and symptoms have improved.

## 2024-01-28 NOTE — Telephone Encounter (Signed)
 PA for Phexxi approved. Patient notified.

## 2024-02-01 ENCOUNTER — Encounter: Payer: Self-pay | Admitting: Obstetrics and Gynecology

## 2024-02-05 ENCOUNTER — Encounter (HOSPITAL_COMMUNITY): Payer: Self-pay

## 2024-02-05 ENCOUNTER — Ambulatory Visit (HOSPITAL_COMMUNITY)
Admission: RE | Admit: 2024-02-05 | Discharge: 2024-02-05 | Disposition: A | Discharge status: Home / Self Care | Source: Ambulatory Visit | Attending: Family Medicine | Admitting: Family Medicine

## 2024-02-05 VITALS — BP 102/67 | HR 71 | Temp 98.2°F | Resp 13

## 2024-02-05 DIAGNOSIS — J392 Other diseases of pharynx: Secondary | ICD-10-CM
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
## 2024-02-05 NOTE — ED Triage Notes (Signed)
 Pt reports took two antibiotics. Reports noticed white cast on tongue that is not normally there. Wanting to make sure doesn't have thrush.

## 2024-05-13 ENCOUNTER — Ambulatory Visit: Payer: Self-pay
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
## 2024-05-22 ENCOUNTER — Ambulatory Visit

## 2024-05-22 ENCOUNTER — Ambulatory Visit
Admission: RE | Admit: 2024-05-22 | Discharge: 2024-05-22 | Disposition: A | Discharge status: Home / Self Care | Attending: Internal Medicine

## 2024-05-22 VITALS — BP 106/71 | HR 74 | Temp 98.5°F | Resp 16

## 2024-05-22 DIAGNOSIS — H00014 Hordeolum externum left upper eyelid: Secondary | ICD-10-CM

## 2024-05-22 NOTE — ED Triage Notes (Signed)
 Patient reports noticing soreness along the left eyelash line yesterday morning, which progressed to swelling in the area today. Patient denies any sensitivity to light.  Home interventions: none

## 2024-05-22 NOTE — Discharge Instructions (Addendum)
 It appears that you have developed a stye (hordeolum) on your eyelid  These are easily treated with at-home measures and generally resolve over 1-2 weeks  Use a warm compress over the eye for 5-10 minutes at a time, 2-4 times per day to help drainage Use a gentle solution of diluted baby wash and water to wash your eyelids and eyelashes at night to prevent buildup of skin and dried tears You can use artificial tears and lubricating eye drops to help with irritation to your eye  Options include Blink tears and Refresh   If the swelling persists or increases in size to encompass the entire eye area, you develop a fever, vision changes or copious amounts of drainage that is yellow-green.
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
## 2024-05-25 ENCOUNTER — Ambulatory Visit

## 2024-06-09 ENCOUNTER — Ambulatory Visit: Payer: 59 | Admitting: Obstetrics and Gynecology
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
## 2024-06-16 ENCOUNTER — Ambulatory Visit (INDEPENDENT_AMBULATORY_CARE_PROVIDER_SITE_OTHER): Admitting: Obstetrics and Gynecology

## 2024-06-16 ENCOUNTER — Encounter: Payer: Self-pay | Admitting: Obstetrics and Gynecology

## 2024-06-16 ENCOUNTER — Other Ambulatory Visit (HOSPITAL_COMMUNITY)
Admission: RE | Admit: 2024-06-16 | Discharge: 2024-06-16 | Disposition: A | Discharge status: Home / Self Care | Source: Ambulatory Visit | Attending: Obstetrics and Gynecology | Admitting: Obstetrics and Gynecology

## 2024-06-16 VITALS — BP 114/70 | HR 84 | Ht 65.75 in | Wt 120.0 lb

## 2024-06-16 DIAGNOSIS — Z1331 Encounter for screening for depression: Secondary | ICD-10-CM | POA: Diagnosis not present

## 2024-06-16 DIAGNOSIS — Z124 Encounter for screening for malignant neoplasm of cervix: Secondary | ICD-10-CM

## 2024-06-16 DIAGNOSIS — N870 Mild cervical dysplasia: Secondary | ICD-10-CM | POA: Diagnosis not present

## 2024-06-16 DIAGNOSIS — Z113 Encounter for screening for infections with a predominantly sexual mode of transmission: Secondary | ICD-10-CM

## 2024-06-16 DIAGNOSIS — Z1151 Encounter for screening for human papillomavirus (HPV): Secondary | ICD-10-CM | POA: Insufficient documentation

## 2024-06-16 DIAGNOSIS — Z01419 Encounter for gynecological examination (general) (routine) without abnormal findings: Secondary | ICD-10-CM | POA: Diagnosis not present

## 2024-06-16 DIAGNOSIS — Z0189 Encounter for other specified special examinations: Secondary | ICD-10-CM

## 2024-06-16 DIAGNOSIS — Z1159 Encounter for screening for other viral diseases: Secondary | ICD-10-CM

## 2024-06-16 DIAGNOSIS — Z114 Encounter for screening for human immunodeficiency virus [HIV]: Secondary | ICD-10-CM

## 2024-06-16 MED ORDER — VALACYCLOVIR HCL 500 MG PO TABS: ORAL_TABLET | ORAL | 3 refills | Status: DC

## 2024-06-16 NOTE — Patient Instructions (Signed)

## 2024-06-17 ENCOUNTER — Ambulatory Visit: Payer: Self-pay | Admitting: Obstetrics and Gynecology

## 2024-06-17 LAB — RPR: RPR Ser Ql: NONREACTIVE

## 2024-06-17 LAB — LIPID PANEL
Cholesterol: 151 mg/dL (ref <200)
HDL: 76 mg/dL (ref >=50)
LDL Cholesterol (Calc): 64 mg/dL
Non-HDL Cholesterol (Calc): 75 mg/dL (ref <130)
Total CHOL/HDL Ratio: 2 (calc) (ref <5.0)
Triglycerides: 37 mg/dL (ref <150)

## 2024-06-17 LAB — HEPATITIS C ANTIBODY: Hepatitis C Ab: NONREACTIVE

## 2024-06-17 LAB — HIV ANTIBODY (ROUTINE TESTING W REFLEX)
HIV 1&2 Ab, 4th Generation: NONREACTIVE
HIV FINAL INTERPRETATION: NEGATIVE

## 2024-06-22 LAB — CYTOLOGY - PAP
Chlamydia: NEGATIVE
Comment: NEGATIVE
Comment: NEGATIVE
Comment: NEGATIVE
Comment: NORMAL
Diagnosis: NEGATIVE
High risk HPV: NEGATIVE
Neisseria Gonorrhea: NEGATIVE
Trichomonas: NEGATIVE

## 2024-09-03 ENCOUNTER — Ambulatory Visit: Admitting: Obstetrics and Gynecology

## 2024-09-03 ENCOUNTER — Encounter: Payer: Self-pay | Admitting: Obstetrics and Gynecology

## 2024-09-03 ENCOUNTER — Other Ambulatory Visit (HOSPITAL_COMMUNITY)
Admission: RE | Admit: 2024-09-03 | Discharge: 2024-09-03 | Disposition: A | Source: Ambulatory Visit | Attending: Obstetrics and Gynecology | Admitting: Obstetrics and Gynecology

## 2024-09-03 VITALS — BP 112/82

## 2024-09-03 DIAGNOSIS — Z113 Encounter for screening for infections with a predominantly sexual mode of transmission: Secondary | ICD-10-CM

## 2024-09-03 DIAGNOSIS — N760 Acute vaginitis: Secondary | ICD-10-CM

## 2024-09-03 DIAGNOSIS — Z114 Encounter for screening for human immunodeficiency virus [HIV]: Secondary | ICD-10-CM

## 2024-09-03 DIAGNOSIS — Z1159 Encounter for screening for other viral diseases: Secondary | ICD-10-CM

## 2024-09-03 LAB — WET PREP FOR TRICH, YEAST, CLUE

## 2024-09-03 MED ORDER — VALACYCLOVIR HCL 500 MG PO TABS: ORAL_TABLET | ORAL | 2 refills | Status: AC
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
## 2024-09-05 LAB — SYPHILIS: RPR W/REFLEX TO RPR TITER AND TREPONEMAL ANTIBODIES, TRADITIONAL SCREENING AND DIAGNOSIS ALGORITHM: RPR Ser Ql: NONREACTIVE

## 2024-09-05 LAB — HEPATITIS C ANTIBODY: Hepatitis C Ab: NONREACTIVE

## 2024-09-05 LAB — HIV ANTIBODY (ROUTINE TESTING W REFLEX)
HIV 1&2 Ab, 4th Generation: NONREACTIVE
HIV FINAL INTERPRETATION: NEGATIVE

## 2024-09-06 LAB — CERVICOVAGINAL ANCILLARY ONLY
Bacterial Vaginitis (gardnerella): NEGATIVE
Candida Glabrata: NEGATIVE
Candida Vaginitis: POSITIVE — AB
Chlamydia: NEGATIVE
Comment: NEGATIVE
Comment: NEGATIVE
Comment: NEGATIVE
Comment: NEGATIVE
Comment: NEGATIVE
Comment: NORMAL
Neisseria Gonorrhea: NEGATIVE
Trichomonas: NEGATIVE

## 2024-09-08 ENCOUNTER — Ambulatory Visit: Payer: Self-pay | Admitting: Obstetrics and Gynecology

## 2024-09-08 ENCOUNTER — Encounter: Payer: Self-pay | Admitting: Obstetrics and Gynecology

## 2024-09-08 ENCOUNTER — Other Ambulatory Visit: Payer: Self-pay | Admitting: Obstetrics and Gynecology

## 2024-09-08 MED ORDER — FLUCONAZOLE 150 MG PO TABS: 150.0000 mg | ORAL_TABLET | Freq: Once | ORAL | 0 refills | Status: AC

## 2024-09-17 ENCOUNTER — Ambulatory Visit (INDEPENDENT_AMBULATORY_CARE_PROVIDER_SITE_OTHER): Admitting: Radiology

## 2024-09-17 ENCOUNTER — Encounter: Payer: Self-pay | Admitting: Radiology

## 2024-09-17 VITALS — BP 98/62 | HR 77 | Wt 114.0 lb

## 2024-09-17 DIAGNOSIS — N898 Other specified noninflammatory disorders of vagina: Secondary | ICD-10-CM | POA: Diagnosis not present

## 2024-09-17 LAB — WET PREP FOR TRICH, YEAST, CLUE

## 2024-09-17 MED ORDER — CLINDAMYCIN PHOSPHATE 2 % VA CREA: 1.0000 | TOPICAL_CREAM | Freq: Every day | VAGINAL | 0 refills | Status: AC

## 2024-09-17 MED ORDER — FLUCONAZOLE 150 MG PO TABS: 150.0000 mg | ORAL_TABLET | ORAL | 0 refills | Status: AC
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
## 2025-06-23 ENCOUNTER — Ambulatory Visit: Admitting: Obstetrics and Gynecology
